# Patient Record
Sex: Male | Born: 1995 | Race: White | Hispanic: No | Marital: Single | Smoking: Former smoker
Health system: Southern US, Community
[De-identification: ages and names within clinical notes are randomized; demographics above are authoritative.]

## PROBLEM LIST (undated history)

## (undated) DIAGNOSIS — Z87898 Personal history of other specified conditions: Secondary | ICD-10-CM

## (undated) DIAGNOSIS — F1491 Cocaine use, unspecified, in remission: Secondary | ICD-10-CM

## (undated) DIAGNOSIS — F32A Depression, unspecified: Secondary | ICD-10-CM

## (undated) DIAGNOSIS — K8591 Acute pancreatitis with uninfected necrosis, unspecified: Secondary | ICD-10-CM

## (undated) DIAGNOSIS — F329 Major depressive disorder, single episode, unspecified: Secondary | ICD-10-CM

## (undated) DIAGNOSIS — F909 Attention-deficit hyperactivity disorder, unspecified type: Secondary | ICD-10-CM

## (undated) DIAGNOSIS — K859 Acute pancreatitis without necrosis or infection, unspecified: Secondary | ICD-10-CM

## (undated) DIAGNOSIS — F1290 Cannabis use, unspecified, uncomplicated: Secondary | ICD-10-CM

## (undated) HISTORY — PX: NO PAST SURGERIES: SHX2092

## (undated) HISTORY — PX: CIRCUMCISION: SUR203

## (undated) HISTORY — DX: Acute pancreatitis with uninfected necrosis, unspecified: K85.91

---
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
## 2013-02-21 ENCOUNTER — Other Ambulatory Visit (HOSPITAL_COMMUNITY): Payer: Self-pay | Admitting: Psychiatry

## 2013-03-14 ENCOUNTER — Emergency Department (HOSPITAL_COMMUNITY)
Admission: EM | Admit: 2013-03-14 | Discharge: 2013-03-14 | Disposition: A | Discharge status: Home / Self Care | Payer: BC Managed Care – PPO | Attending: Emergency Medicine | Admitting: Emergency Medicine

## 2013-03-14 ENCOUNTER — Encounter (HOSPITAL_COMMUNITY): Payer: Self-pay | Admitting: Emergency Medicine

## 2013-03-14 DIAGNOSIS — R110 Nausea: Secondary | ICD-10-CM | POA: Insufficient documentation

## 2013-03-14 DIAGNOSIS — R509 Fever, unspecified: Secondary | ICD-10-CM | POA: Insufficient documentation

## 2013-03-14 DIAGNOSIS — Z8719 Personal history of other diseases of the digestive system: Secondary | ICD-10-CM | POA: Insufficient documentation

## 2013-03-14 DIAGNOSIS — K859 Acute pancreatitis without necrosis or infection, unspecified: Secondary | ICD-10-CM

## 2013-03-14 DIAGNOSIS — Z8659 Personal history of other mental and behavioral disorders: Secondary | ICD-10-CM | POA: Insufficient documentation

## 2013-03-14 DIAGNOSIS — Z87891 Personal history of nicotine dependence: Secondary | ICD-10-CM | POA: Insufficient documentation

## 2013-03-14 LAB — COMPREHENSIVE METABOLIC PANEL
ALT: 24 U/L (ref 0–53)
AST: 28 U/L (ref 0–37)
Albumin: 4 g/dL (ref 3.5–5.2)
CO2: 31 mEq/L (ref 19–32)
Calcium: 10.1 mg/dL (ref 8.4–10.5)
GFR calc non Af Amer: >90 mL/min (ref >90)
Sodium: 140 mEq/L (ref 135–145)
Total Protein: 7 g/dL (ref 6.0–8.3)

## 2013-03-14 LAB — URINALYSIS, ROUTINE W REFLEX MICROSCOPIC
Glucose, UA: NEGATIVE mg/dL
Hgb urine dipstick: NEGATIVE
Leukocytes, UA: NEGATIVE
Specific Gravity, Urine: 1.014 (ref 1.005–1.030)
pH: 8 (ref 5.0–8.0)

## 2013-03-14 LAB — CBC WITH DIFFERENTIAL/PLATELET
Basophils Absolute: 0 10*3/uL (ref 0.0–0.1)
Basophils Relative: 0 % (ref 0–1)
Eosinophils Relative: 1 % (ref 0–5)
Lymphocytes Relative: 25 % (ref 12–46)
MCHC: 34.4 g/dL (ref 30.0–36.0)
MCV: 92.9 fL (ref 78.0–100.0)
Platelets: 345 10*3/uL (ref 150–400)
RDW: 12.7 % (ref 11.5–15.5)
WBC: 10.3 10*3/uL (ref 4.0–10.5)

## 2013-03-14 MED ORDER — MORPHINE SULFATE 4 MG/ML IJ SOLN
4.0000 mg | Freq: Once | INTRAMUSCULAR | Status: AC
  Administered 2013-03-14: 4 mg via INTRAVENOUS
  Filled 2013-03-14: qty 1

## 2013-03-14 MED ORDER — ONDANSETRON HCL 4 MG/2ML IJ SOLN
4.0000 mg | Freq: Once | INTRAMUSCULAR | Status: AC
  Administered 2013-03-14: 4 mg via INTRAVENOUS
  Filled 2013-03-14: qty 2

## 2013-03-14 MED ORDER — OXYCODONE-ACETAMINOPHEN 5-325 MG PO TABS: 1.0000 | ORAL_TABLET | ORAL | Status: DC | PRN

## 2013-03-14 MED ORDER — PROMETHAZINE HCL 25 MG PO TABS: 25.0000 mg | ORAL_TABLET | Freq: Four times a day (QID) | ORAL | Status: DC | PRN

## 2013-03-14 MED ORDER — SODIUM CHLORIDE 0.9 % IV BOLUS (SEPSIS)
1000.0000 mL | Freq: Once | INTRAVENOUS | Status: AC
  Administered 2013-03-14: 1000 mL via INTRAVENOUS

## 2013-03-14 NOTE — ED Notes (Signed)
Pts mom at window states pts chest is hurting him and he has been left out in the waiting room. I brought pt back to triage and reassessed. Pt c/o upper epigastric pain, states tightness to whole abdomen area, hx of pancreatitis but feels different, c/o nausea no vomiting; pts VSS, no distress noted; pt placed back in waiting area and notified pt will go back as soon as we can but let us know if pain get worse.

## 2013-03-14 NOTE — ED Provider Notes (Signed)
TIME SEEN: 3:20 PM  CHIEF COMPLAINT: Abdominal pain  HPI: Patient is a 17 year old male with a history of pancreatitis and pseudocyst who presents to the emergency department with one day of epigastric abdominal pain, nausea. He states that he drank a bottle of wine 2 days ago and he feels this may have triggered his pancreatitis. He is unsure if this pain is similar to his prior episode of pancreatitis. Denies any vomiting, diarrhea, melena or bloody stools. No NSAID use.  ROS: See HPI Constitutional: subjective fever  Eyes: no drainage  ENT: no runny nose   Cardiovascular:  no chest pain  Resp: no SOB  GI: no vomiting GU: no dysuria Integumentary: no rash  Allergy: no hives  Musculoskeletal: no leg swelling  Neurological: no slurred speech ROS otherwise negative  PAST MEDICAL HISTORY/PAST SURGICAL HISTORY:  Past Medical History  Diagnosis Date  . Pancreatitis, necrotizing   . Pancreatitis 10/2011  . Marijuana use   . History of cocaine use     states only used 1x 2 weeks before UDS +, associated with pancreatitis   . ADHD (attention deficit hyperactivity disorder)     MEDICATIONS:  Prior to Admission medications   Medication Sig Start Date End Date Taking? Authorizing Provider  calcium carbonate (TUMS - DOSED IN MG ELEMENTAL CALCIUM) 500 MG chewable tablet Chew 2 tablets by mouth once.   Yes Historical Provider, MD    ALLERGIES:  No Known Allergies  SOCIAL HISTORY:  History  Substance Use Topics  . Smoking status: Former Smoker -- 0.25 packs/day for 2 years    Types: Cigarettes    Start date: 07/27/2012    Quit date: 11/04/2012  . Smokeless tobacco: Current User  . Alcohol Use: No     Comment: 12/16/11 "quit drinking end of  Sept. 2013; Reports that the most he drank was a 6 pack or 3-4 shots a liquor.    FAMILY HISTORY: Family History  Problem Relation Age of Onset  . Hypertension Mother   . Anxiety disorder Mother   . Cancer Father   . Diabetes Father   .  Depression Father   . Drug abuse Father   . Cancer Paternal Aunt   . Heart disease Paternal Grandfather   . Emphysema Paternal Grandfather   . Heart attack Paternal Uncle     EXAM: BP 137/76  Pulse 75  Temp(Src) 98.7 F (37.1 C) (Oral)  Resp 18  SpO2 98% CONSTITUTIONAL: Alert and oriented and responds appropriately to questions. Well-appearing; well-nourished HEAD: Normocephalic EYES: Conjunctivae clear, PERRL ENT: normal nose; no rhinorrhea; moist mucous membranes; pharynx without lesions noted NECK: Supple, no meningismus, no LAD  CARD: RRR; S1 and S2 appreciated; no murmurs, no clicks, no rubs, no gallops RESP: Normal chest excursion without splinting or tachypnea; breath sounds clear and equal bilaterally; no wheezes, no rhonchi, no rales,  ABD/GI: Normal bowel sounds; non-distended; soft, non-tender, no rebound, no guarding BACK:  The back appears normal and is non-tender to palpation, there is no CVA tenderness EXT: Normal ROM in all joints; non-tender to palpation; no edema; normal capillary refill; no cyanosis    SKIN: Normal color for age and race; warm NEURO: Moves all extremities equally PSYCH: The patient's mood and manner are appropriate. Grooming and personal hygiene are appropriate.  MEDICAL DECISION MAKING: Patient with a history of pancreatitis who presents to ED with one day of similar symptoms after eating [place] this morning.  Has had a history of pancreatic pseudocyst that he reports  was drained in April 2013. He has had an endoscopy in the past which he reports was unremarkable. No history of peptic ulcer disease. No history of cholelithiasis. His abdominal exam is benign. His vitals are within normal limits. We'll obtain labs, urinalysis, and pain medication and anti-emetics. I do not feel he needs imaging at this time given his exam is very benign. Patient agrees with this plan.  ED PROGRESS: Patient has elevated lipase consistent with pancreatitis. Given  his very benign abdominal exam, I do not feel he needs abdominal imaging at this time. He is hemodynamically stable and has been able to tolerate by mouth. He is not vomiting in the ED. Patient and family would like discharge home with outpatient followup with her gastroenterologist, Dr. Dulce Sellar with [facility].  Given customary and usual return precautions. Patient verbalizes understanding is comfortable plan.  Layla Maw Ward, DO 03/14/13 1701

## 2013-03-14 NOTE — ED Notes (Signed)
MD at bedside. 

## 2013-03-14 NOTE — ED Notes (Signed)
Pt states hx of pancreatitis.  States that he has had upper abd pain x 1 day.  States that he has felt "bad" for a few days with diarrhea and nausea.  States he was in ICU for pancreatitis last year. States that he drank a bottle of wine a couple days ago.

## 2013-05-21 ENCOUNTER — Ambulatory Visit (INDEPENDENT_AMBULATORY_CARE_PROVIDER_SITE_OTHER): Payer: BC Managed Care – PPO | Admitting: Emergency Medicine

## 2013-05-21 VITALS — BP 102/60 | HR 86 | Temp 98.4°F | Resp 16 | Ht 72.5 in | Wt 185.2 lb

## 2013-05-21 DIAGNOSIS — L500 Allergic urticaria: Secondary | ICD-10-CM

## 2013-05-21 MED ORDER — TRIAMCINOLONE ACETONIDE 0.1 % EX CREA: TOPICAL_CREAM | Freq: Two times a day (BID) | CUTANEOUS | Status: DC

## 2013-05-21 MED ORDER — METHYLPREDNISOLONE ACETATE 80 MG/ML IJ SUSP
80.0000 mg | Freq: Once | INTRAMUSCULAR | Status: AC
  Administered 2013-05-21: 80 mg via INTRAMUSCULAR
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
## 2013-05-21 NOTE — Patient Instructions (Signed)

## 2013-05-22 ENCOUNTER — Emergency Department (HOSPITAL_COMMUNITY)
Admission: EM | Admit: 2013-05-22 | Discharge: 2013-05-22 | Disposition: A | Discharge status: Home / Self Care | Payer: BC Managed Care – PPO | Attending: Emergency Medicine | Admitting: Emergency Medicine

## 2013-05-22 ENCOUNTER — Encounter (HOSPITAL_COMMUNITY): Payer: Self-pay | Admitting: Emergency Medicine

## 2013-05-22 DIAGNOSIS — Z87891 Personal history of nicotine dependence: Secondary | ICD-10-CM | POA: Insufficient documentation

## 2013-05-22 DIAGNOSIS — Z8659 Personal history of other mental and behavioral disorders: Secondary | ICD-10-CM | POA: Insufficient documentation

## 2013-05-22 DIAGNOSIS — L519 Erythema multiforme, unspecified: Secondary | ICD-10-CM | POA: Insufficient documentation

## 2013-05-22 DIAGNOSIS — F1210 Cannabis abuse, uncomplicated: Secondary | ICD-10-CM | POA: Insufficient documentation

## 2013-05-22 DIAGNOSIS — Z8719 Personal history of other diseases of the digestive system: Secondary | ICD-10-CM | POA: Insufficient documentation

## 2013-05-22 MED ORDER — HYDROXYZINE HCL 25 MG PO TABS
25.0000 mg | ORAL_TABLET | Freq: Once | ORAL | Status: AC
  Administered 2013-05-22: 25 mg via ORAL
  Filled 2013-05-22: qty 1

## 2013-05-22 MED ORDER — HYDROXYZINE HCL 25 MG PO TABS: 25.0000 mg | ORAL_TABLET | Freq: Four times a day (QID) | ORAL | Status: DC

## 2013-05-22 NOTE — ED Provider Notes (Signed)
CSN: [id number]     Arrival date & time 05/22/13  0440 History   First MD Initiated Contact with Patient 05/22/13 [phone]     Chief Complaint  Patient presents with  . Rash   HPI  History provided by the patient. Patient is a 17 year old male who presents with complaints of persistent pruritic rash of the skin. Patient first began having red lesions and rash with slight itching to his lower extremities one week ago. He states this occurred shortly after he was outside cutting wood with a chain saw. He states his legs were covered with sawdust. Since that time he has had waxing and waning rash over his body and also complains of significant itching especially to the scalp. Patient has been taking Benadryl to try to help with the itching without any relief. He also used hydrocortisone and calamine lotion creams at home. He reports the best improvement with calamine lotions. Yesterday he went to an urgent care center where she was given an injection of hydrocortisone steroid but states he continued to have new areas of rash on the skin. He denies having any associated fever, chills or sweats. Denies any respiratory complaints. Denies having similar symptoms previously. No other aggravating or alleviating factors. No other associated symptoms.    Past Medical History  Diagnosis Date  . Pancreatitis, necrotizing   . Pancreatitis 10/2011  . Marijuana use   . History of cocaine use     states only used 1x 2 weeks before UDS +, associated with pancreatitis   . ADHD (attention deficit hyperactivity disorder)    Past Surgical History  Procedure Laterality Date  . Circumcision    . No past surgeries    . Eus  06/29/2012    Procedure: ESOPHAGEAL ENDOSCOPIC ULTRASOUND (EUS) RADIAL;  Surgeon: Arta Silence, MD;  Location: [facility];  Service: Endoscopy;  Laterality: N/A;  . Esophagogastroduodenoscopy  08/03/2012    Procedure: ESOPHAGOGASTRODUODENOSCOPY (EGD);  Surgeon: Arta Silence, MD;  Location: [facility];  Service: Endoscopy;  Laterality: N/A;  . Eus  08/03/2012    Procedure: UPPER ENDOSCOPIC ULTRASOUND (EUS) LINEAR;  Surgeon: Arta Silence, MD;  Location: [facility];  Service: Endoscopy;  Laterality: N/A;   Family History  Problem Relation Age of Onset  . Hypertension Mother   . Anxiety disorder Mother   . Cancer Father   . Diabetes Father   . Depression Father   . Drug abuse Father   . Cancer Paternal Aunt   . Heart disease Paternal Grandfather   . Emphysema Paternal Grandfather   . Heart attack Paternal Uncle    History  Substance Use Topics  . Smoking status: Former Smoker -- 0.25 packs/day for 2 years    Types: Cigarettes    Start date: 07/27/2012    Quit date: 11/04/2012  . Smokeless tobacco: Current User  . Alcohol Use: No     Comment: 12/16/11 "quit drinking end of  Sept. 2013; Reports that the most he drank was a 6 pack or 3-4 shots a liquor.    Review of Systems  Constitutional: Negative for fever, chills and diaphoresis.  HENT: Negative for sore throat.   Respiratory: Negative for cough, shortness of breath, wheezing and stridor.   Cardiovascular: Negative for chest pain.  Skin: Positive for rash.  All other systems reviewed and are negative.    Allergies  Review of patient's allergies indicates no known allergies.  Home Medications   Current Outpatient Rx  Name  Route  Sig  Dispense  Refill  . calamine lotion   Topical   Apply 1 application topically 2 (two) times daily as needed (itching).         . diphenhydrAMINE (BENADRYL) 12.5 MG/5ML liquid   Oral   Take 37.5 mg by mouth 3 (three) times daily as needed for itching or allergies.         . hydrocortisone cream 1 %   Topical   Apply 1 application topically 3 (three) times daily as needed (itch).          BP 110/70  Pulse 90  Temp(Src) 98.4 F (36.9 C) (Oral)  Resp 15  Ht 6' (1.829 m)  Wt 185 lb (83.915 kg)  BMI 25.08 kg/m2  SpO2 97% Physical Exam  Nursing note and  vitals reviewed. Constitutional: He appears well-developed and well-nourished. No distress.  HENT:  Head: Normocephalic.  Neck: Normal range of motion. Neck supple.  Cardiovascular: Normal rate and regular rhythm.   Pulmonary/Chest: Effort normal and breath sounds normal. No stridor. No respiratory distress. He has no wheezes. He has no rales.  Neurological: He is alert.  Skin: Skin is warm. Rash noted.  There is a singular annular erythematous from lesion to the left lower abdomen. Several other erythematous macular lesions to the anterior lower legs, right lower abdomen. Several erythematous macular serpentine-type lesions to the scalp. There is occasional nodularity to the lesions on the scalp.  Psychiatric: He has a normal mood and affect. His behavior is normal.    ED Course  Procedures    COORDINATION OF CARE:  Nursing notes reviewed. Vital signs reviewed. Initial pt interview and examination performed.   5:35 AM-patient seen and evaluated. Patient appears well no acute distress. Pt was also seen adn evaluated by Attending Physician.  At this time suspect the rash may be caused by Erythema multiforme minor. Discussed continued treatment plan with pt at bedside, which includes hydroxyzine for itching. We will also plan to give dermatology referral.  Pt agrees with plan.    MDM   1. Erythema multiforme minor        Martie Lee, [place] 05/22/13 [phone]

## 2013-05-22 NOTE — ED Provider Notes (Signed)
Scattered target lesions consistent with erythema multiforme minor.  Medical screening examination/treatment/procedure(s) were conducted as a shared visit with non-physician practitioner(s) and myself.  I personally evaluated the patient during the encounter.  EKG Interpretation   None         Wynetta Fines, MD 05/22/13 [phone]

## 2013-05-22 NOTE — ED Notes (Signed)
Pt has rash to b/l LE as well as on his back, back of his neck, head and b/l lower abdomen.  Rash is light pink, flat  and circular.  Pt reports burning itching sensation in areas rash is present.

## 2013-05-22 NOTE — ED Notes (Signed)
Pt A&O x3, denies pain.  D/c instructions as well as follow up reviewed no questions at this time.

## 2013-05-22 NOTE — ED Notes (Signed)
Pt states after cutting firewood 1 week ago and becoming covered with saw dust now having extreme pruritis to legs, head and neck. Pt seen by urgent care given steroid injection and taking PO Benadryl. Pt feels itching and redness worse.

## 2013-07-12 IMAGING — CT CT ABD-PELV W/ CM
2 of 4 series · 12 of 32 positions shown, 17 images · IV contrast (water/omni  & 80ml omni 300)
Comparison: No priors.

CLINICAL DATA: Abdominal pain.  Hematuria.

CT ABDOMEN AND PELVIS WITH CONTRAST
TECHNIQUE: Multidetector CT imaging of the abdomen and pelvis was
performed following the standard protocol during bolus
administration of intravenous contrast.
Contrast: 80mL OMNIPAQUE IOHEXOL 300 MG/ML  SOLN

[Series 2: routine abdomen · axial · 0.70mm/px · z∈[-485,-110]mm · 8 of 97 slices shown, 13 images]
[im 11/97  soft-tissue]
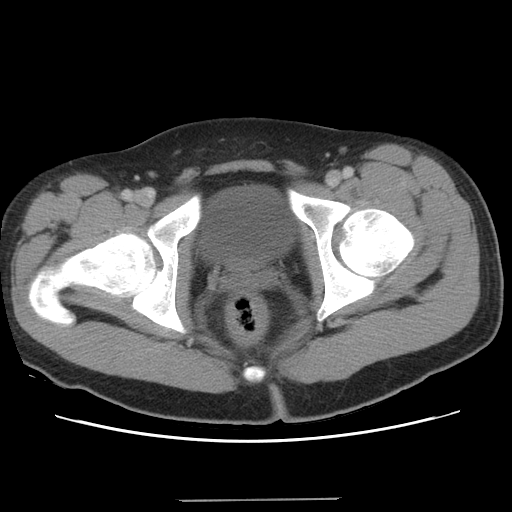
[im 11/97  bone]
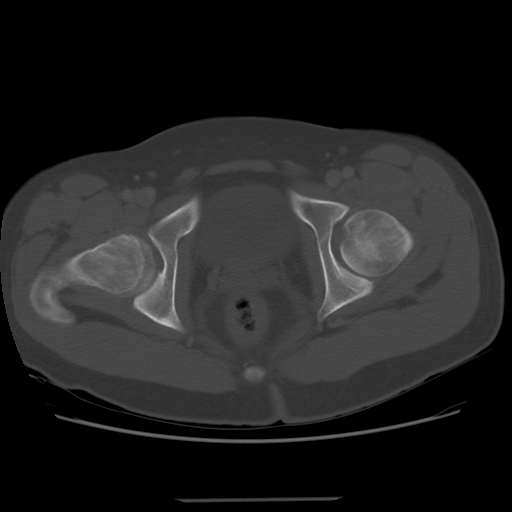
[im 22/97  soft-tissue]
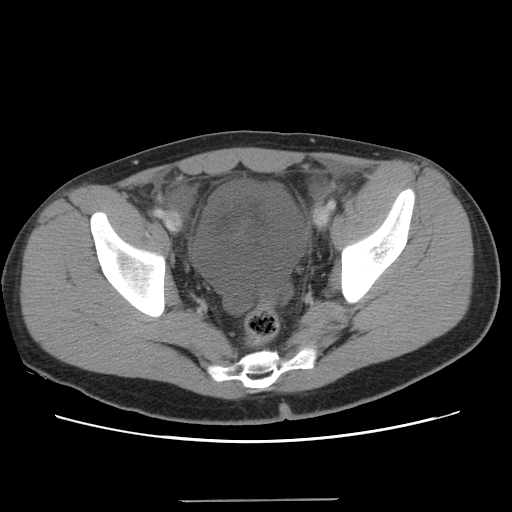
[im 33/97  soft-tissue]
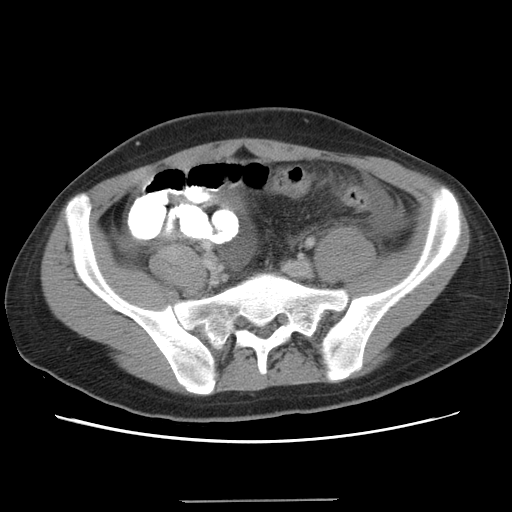
[im 43/97  soft-tissue]
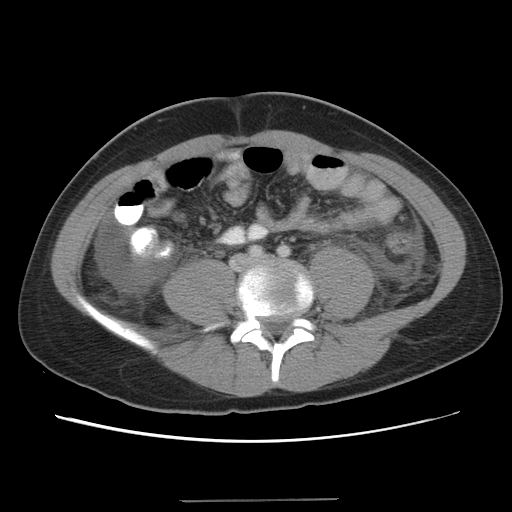
[im 54/97  soft-tissue]
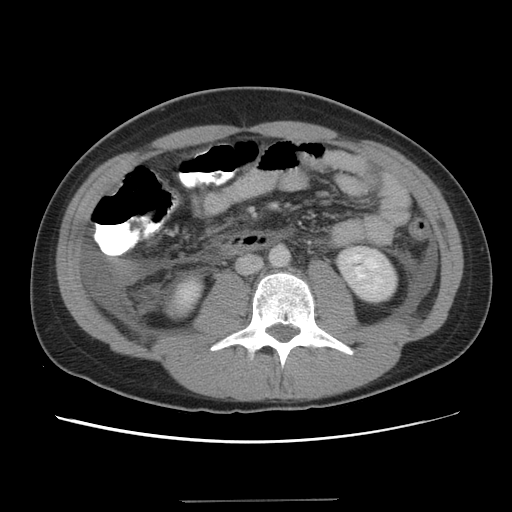
[im 54/97  lung]
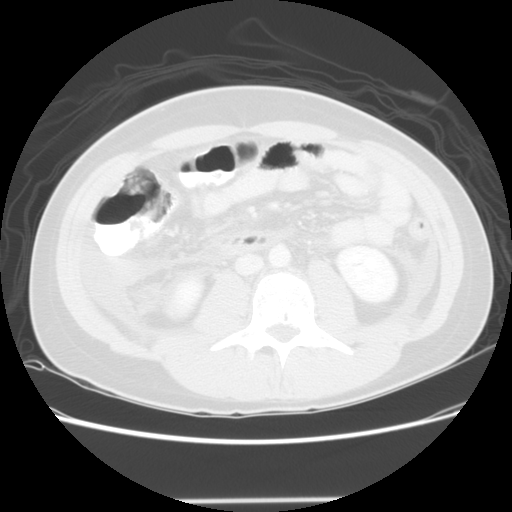
[im 65/97  soft-tissue]
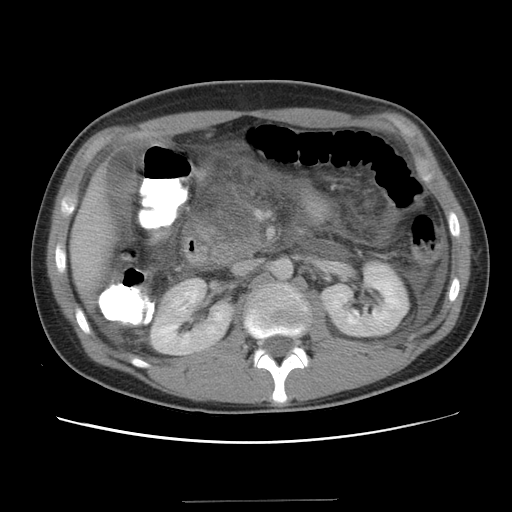
[im 65/97  lung]
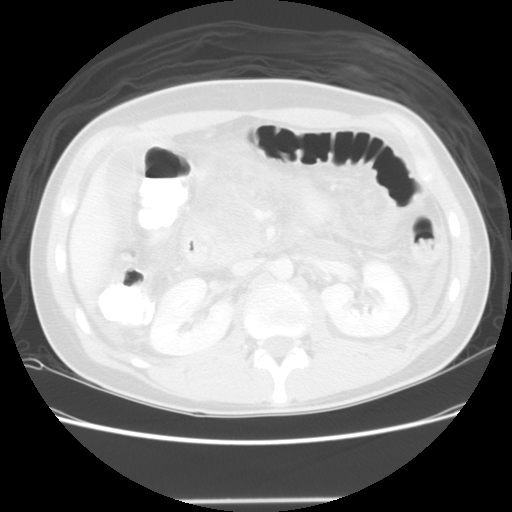
[im 75/97  soft-tissue]
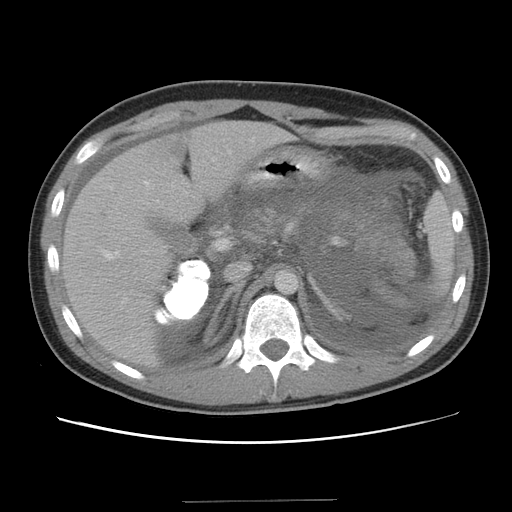
[im 75/97  lung]
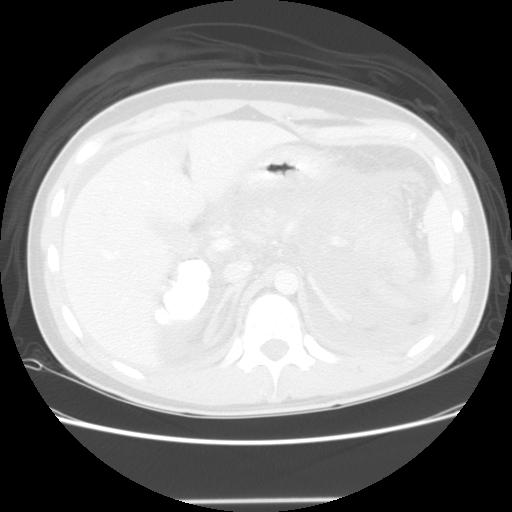
[im 86/97  soft-tissue]
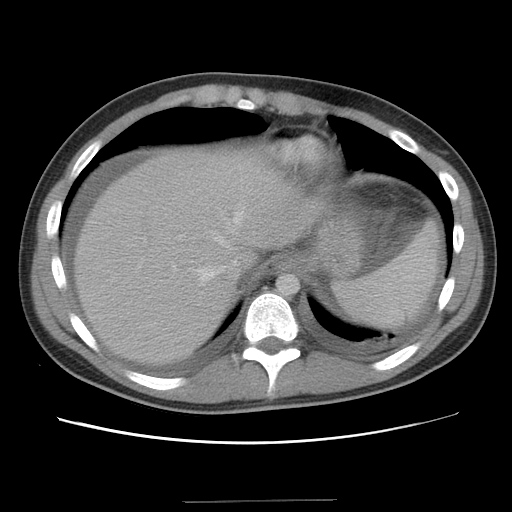
[im 86/97  lung]
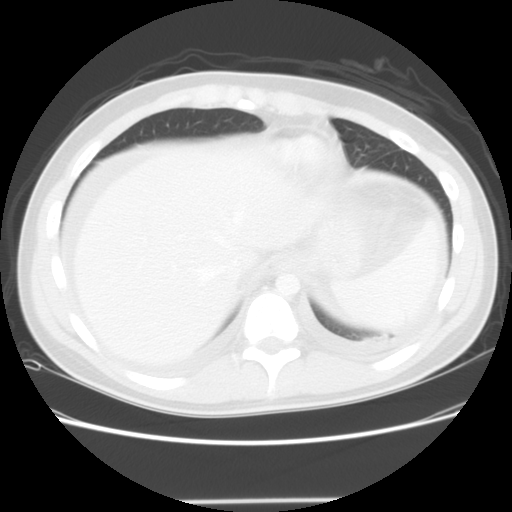

[Series 400: sagittal · sagittal · 0.99mm/px · 4 of 105 slices shown]
[im 11/105  soft-tissue]
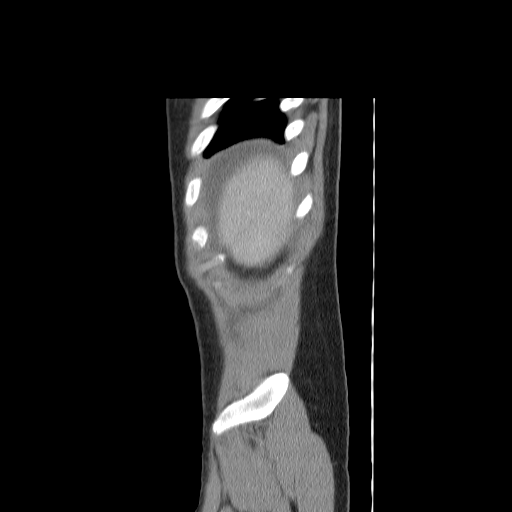
[im 21/105  soft-tissue]
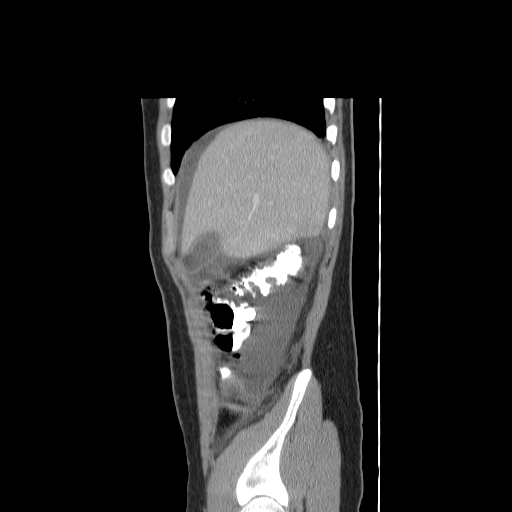
[im 32/105  soft-tissue]
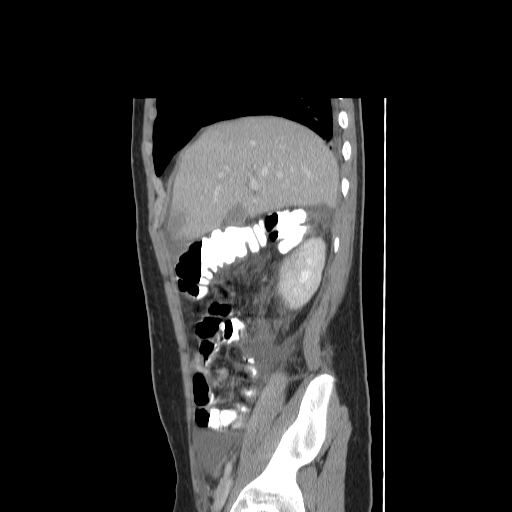
[im 42/105  soft-tissue]
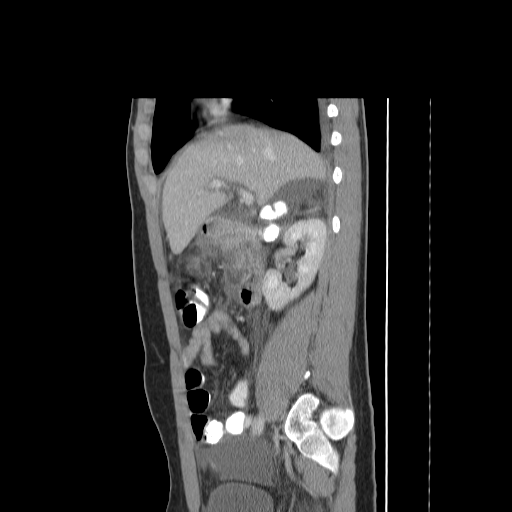

[12 of 32 positions shown; findings below may reference images not displayed]

FINDINGS: Lung Bases: Small bilateral pleural effusions layering dependently.
A small amount of pneumomediastinum is noted around the distal
esophagus.

Abdomen/Pelvis:  The pancreas appears enlarged, demonstrates poor
parenchymal enhancement throughout the majority of the body, and
has extensive surrounding inflammatory changes throughout the
retroperitoneum with inflammatory exudate extending around the
spleen and kidneys bilaterally.  The splenic vein appears narrowed
(presumably from mass effect from adjacent inflammation), however,
the splenic vein, superior mesenteric vein and portal veins are all
patent at this time.  The inflammatory exudate is also tracking
into the lesser omentum and into the mesocolon in the region of the
splenic flexure resulting in some mild colonic wall thickening.
The appendix appears to be normal (demonstrated on image 53 of
series 2).  Moderate - large volume of ascites which appears non
bloody at this time.

Within the segment 8 of the liver there is a linear low attenuation
region which is nonspecific, however, there is a suggestion of a
dilated intrahepatic bile duct leading up to this region.  No other
evidence to suggest intrahepatic biliary ductal dilatation.  The
common bile duct itself appears mildly enlarged in the porta
hepatis (9 mm).  There is a suggestion of a small high attenuation
focus in the dependent portion of the neck of the gallbladder
(image 24 series 2), which could suggest a small gallstone.  Focal
low attenuation is noted adjacent the falciform within segment 4B
of the liver, compatible with focal fatty infiltration and/or
perfusion anomaly.  The remainder the liver is otherwise
unremarkable in appearance.  The spleen, bilateral adrenal glands
and bilateral kidneys are normal in appearance at this time.  No
pneumoperitoneum.  Several borderline dilated loops of small bowel
are noted, which may suggest the presence of a reactive ileus.
Urinary bladder is unremarkable.

Musculoskeletal: There are no aggressive appearing lytic or blastic
lesions noted in the visualized portions of the skeleton.
IMPRESSION: 1.  Findings, as above, consistent with severe pancreatitis.  There
is decreased enhancement throughout the entire body of the pancreas
with relative preserved enhancement in the head, uncinate process
and distal tail.  Findings are concerning for early necrotizing
pancreatitis.  At this time, the portal vein, SMV and splenic vein
are all patent, however, there is severe attenuation of the splenic
vein (presumably from surrounding inflammation), which may suggest
an increased risk for pending splenic vein thrombosis.
2.  Mildly dilated common bile duct (9 mm), with the suggestion of
a calcified gallstone in the neck of the gallbladder.  Although no
distal ductal stone is noted, this may suggest an etiology for the
patient's pancreatitis.
3.  Nonspecific linear low attenuation in segment 8 of the liver.
There is a suggestion of a single minimally dilated intrahepatic
duct extending toward this region.  Clinical correlation for signs
and symptoms of biliary tract obstruction is recommended.
4. Gas adjacent to the distal esophagus in the middle mediastinum.
This is highly concerning for potential esophageal rupture,
particularly if the patient has had a history of recent emesis
(i.e., Jumper syndrome).  Clinical correlation is recommended.
5.  Moderate volume of ascites.
6.  Small bilateral pleural effusions are simple in appearance and
are layering dependently.
7.  Probable mild small bowel ileus.

These results were called by telephone on 11/04/2011  at  [DATE]
a.m. to  Dr.Crawb, who verbally acknowledged these results.

## 2013-07-25 ENCOUNTER — Encounter (HOSPITAL_COMMUNITY): Payer: Self-pay | Admitting: Emergency Medicine

## 2013-07-25 ENCOUNTER — Emergency Department (HOSPITAL_COMMUNITY)
Admission: EM | Admit: 2013-07-25 | Discharge: 2013-07-26 | Disposition: A | Discharge status: Home / Self Care | Payer: BC Managed Care – PPO | Attending: Emergency Medicine | Admitting: Emergency Medicine

## 2013-07-25 DIAGNOSIS — F909 Attention-deficit hyperactivity disorder, unspecified type: Secondary | ICD-10-CM | POA: Insufficient documentation

## 2013-07-25 DIAGNOSIS — F329 Major depressive disorder, single episode, unspecified: Secondary | ICD-10-CM | POA: Insufficient documentation

## 2013-07-25 DIAGNOSIS — Z8719 Personal history of other diseases of the digestive system: Secondary | ICD-10-CM | POA: Insufficient documentation

## 2013-07-25 DIAGNOSIS — R45851 Suicidal ideations: Secondary | ICD-10-CM | POA: Insufficient documentation

## 2013-07-25 DIAGNOSIS — Z87891 Personal history of nicotine dependence: Secondary | ICD-10-CM | POA: Insufficient documentation

## 2013-07-25 DIAGNOSIS — F1210 Cannabis abuse, uncomplicated: Secondary | ICD-10-CM | POA: Insufficient documentation

## 2013-07-25 DIAGNOSIS — Z79899 Other long term (current) drug therapy: Secondary | ICD-10-CM | POA: Insufficient documentation

## 2013-07-25 HISTORY — DX: Major depressive disorder, single episode, unspecified: F32.9

## 2013-07-25 HISTORY — DX: Depression, unspecified: F32.A

## 2013-07-25 LAB — RAPID URINE DRUG SCREEN, HOSP PERFORMED
Amphetamines: NOT DETECTED
Barbiturates: NOT DETECTED

## 2013-07-25 LAB — CBC
HCT: 47.8 % (ref 39.0–52.0)
MCV: 93.7 fL (ref 78.0–100.0)
Platelets: 259 10*3/uL (ref 150–400)
RBC: 5.1 MIL/uL (ref 4.22–5.81)
WBC: 12.4 10*3/uL — ABNORMAL HIGH (ref 4.0–10.5)

## 2013-07-25 LAB — ETHANOL: Alcohol, Ethyl (B): <11 mg/dL (ref 0–11)

## 2013-07-25 LAB — SALICYLATE LEVEL: Salicylate Lvl: <2 mg/dL — ABNORMAL LOW (ref 2.8–20.0)

## 2013-07-25 LAB — COMPREHENSIVE METABOLIC PANEL
ALT: 15 U/L (ref 0–53)
AST: 20 U/L (ref 0–37)
Alkaline Phosphatase: 52 U/L (ref 39–117)
BUN: 7 mg/dL (ref 6–23)
CO2: 26 mEq/L (ref 19–32)
Chloride: 104 mEq/L (ref 96–112)
Creatinine, Ser: 0.94 mg/dL (ref 0.50–1.35)
GFR calc non Af Amer: >90 mL/min (ref >90)
Potassium: 4.1 mEq/L (ref 3.7–5.3)
Total Bilirubin: 0.4 mg/dL (ref 0.3–1.2)
Total Protein: 5.8 g/dL — ABNORMAL LOW (ref 6.0–8.3)

## 2013-07-25 LAB — ACETAMINOPHEN LEVEL: Acetaminophen (Tylenol), Serum: <15 ug/mL (ref 10–30)

## 2013-07-25 MED ORDER — LORAZEPAM 1 MG PO TABS
1.0000 mg | ORAL_TABLET | Freq: Three times a day (TID) | ORAL | Status: DC | PRN
  Administered 2013-07-25 – 2013-07-26 (×3): 1 mg via ORAL
  Filled 2013-07-25 (×3): qty 1

## 2013-07-25 MED ORDER — ACETAMINOPHEN 325 MG PO TABS: 650.0000 mg | ORAL_TABLET | ORAL | Status: DC | PRN

## 2013-07-25 NOTE — Progress Notes (Signed)
Patient's Mommy listed at  [phone]. Patient's Dad listed at [phone].

## 2013-07-25 NOTE — ED Notes (Signed)
Patient continues to sleep. Respirations even and unlabored.  Patient safety maintained, Q 15 checks continue.

## 2013-07-25 NOTE — ED Notes (Signed)
Pt came to the nurses station requesting to have his clothes so he can get dressed and leaved. Explained to pt that Dr Lynelle Doctor will come to talk to him soon; he agreed to wait.

## 2013-07-25 NOTE — BH Assessment (Signed)
Consulted with Dr. Linwood Dibbles to obtain clinicals prior to assessing patient.  Glorious Peach, MS, LCASA Assessment Counselor

## 2013-07-25 NOTE — ED Notes (Signed)
Pt presents to ed with c/o depression and suicidal thoughts, sts he is having issues with girl that is staying with him. Pt denies having plan, but endorses SI

## 2013-07-25 NOTE — ED Provider Notes (Signed)
CSN: [id number]     Arrival date & time 07/25/13  1519 History   First MD Initiated Contact with Patient 07/25/13 1608     Chief Complaint  Patient presents with  . Medical Clearance    The history is provided by the patient.  Pt has been having trouble depression, anxiety and suicidal ideation.  Symptoms have been ongoing for a month but have been worsening.  He started to have thoughts of hurting himself today. He was thinking about cutting his wrists and strangling himself as well as other ways.  He saw his doctor today and was prescribed zoloft but didn't think that would work fast enough.  He felt unsafe and came to the ED. He denies alcohol use but does use marijuana.   Past Medical History  Diagnosis Date  . Pancreatitis, necrotizing   . Pancreatitis 10/2011  . Marijuana use   . History of cocaine use     states only used 1x 2 weeks before UDS +, associated with pancreatitis   . ADHD (attention deficit hyperactivity disorder)    Past Surgical History  Procedure Laterality Date  . Circumcision    . No past surgeries    . Eus  06/29/2012    Procedure: ESOPHAGEAL ENDOSCOPIC ULTRASOUND (EUS) RADIAL;  Surgeon: Willis Modena, MD;  Location: [facility];  Service: Endoscopy;  Laterality: N/A;  . Esophagogastroduodenoscopy  08/03/2012    Procedure: ESOPHAGOGASTRODUODENOSCOPY (EGD);  Surgeon: Willis Modena, MD;  Location: [facility];  Service: Endoscopy;  Laterality: N/A;  . Eus  08/03/2012    Procedure: UPPER ENDOSCOPIC ULTRASOUND (EUS) LINEAR;  Surgeon: Willis Modena, MD;  Location: [facility];  Service: Endoscopy;  Laterality: N/A;   Family History  Problem Relation Age of Onset  . Hypertension Mother   . Anxiety disorder Mother   . Cancer Father   . Diabetes Father   . Depression Father   . Drug abuse Father   . Cancer Paternal Aunt   . Heart disease Paternal Grandfather   . Emphysema Paternal Grandfather   . Heart attack Paternal Uncle    History  Substance Use  Topics  . Smoking status: Former Smoker -- 0.25 packs/day for 2 years    Types: Cigarettes    Start date: 07/27/2012    Quit date: 11/04/2012  . Smokeless tobacco: Current User  . Alcohol Use: No     Comment: 12/16/11 "quit drinking end of  Sept. 2013; Reports that the most he drank was a 6 pack or 3-4 shots a liquor.    Review of Systems  All other systems reviewed and are negative.    Allergies  Review of patient's allergies indicates no known allergies.  Home Medications   Current Outpatient Rx  Name  Route  Sig  Dispense  Refill  . cetirizine (ZYRTEC) 10 MG tablet   Oral   Take 10 mg by mouth daily.          BP 115/62  Pulse 73  Temp(Src) 98.1 F (36.7 C)  Resp 18  SpO2 98% Physical Exam  Nursing note and vitals reviewed. Constitutional: He appears well-developed and well-nourished. No distress.  HENT:  Head: Normocephalic and atraumatic.  Right Ear: External ear normal.  Left Ear: External ear normal.  Eyes: Conjunctivae are normal. Right eye exhibits no discharge. Left eye exhibits no discharge. No scleral icterus.  Neck: Neck supple. No tracheal deviation present.  Cardiovascular: Normal rate, regular rhythm and intact distal pulses.   Pulmonary/Chest: Effort normal and breath  sounds normal. No stridor. No respiratory distress. He has no wheezes. He has no rales.  Abdominal: Soft. Bowel sounds are normal. He exhibits no distension. There is no tenderness. There is no rebound and no guarding.  Musculoskeletal: He exhibits no edema and no tenderness.  Neurological: He is alert. He has normal strength. No sensory deficit. Cranial nerve deficit:  no gross defecits noted. He exhibits normal muscle tone. He displays no seizure activity. Coordination normal.  Skin: Skin is warm and dry. No rash noted.  Psychiatric: His speech is not rapid and/or pressured and not delayed. He is withdrawn. Cognition and memory are not impaired. He exhibits a depressed mood. He  expresses suicidal ideation. He expresses suicidal plans.    ED Course  Procedures (including critical care time) Labs Review Labs Reviewed  CBC - Abnormal; Notable for the following:    WBC 12.4 (*)    All other components within normal limits  COMPREHENSIVE METABOLIC PANEL - Abnormal; Notable for the following:    Total Protein 5.8 (*)    All other components within normal limits  SALICYLATE LEVEL - Abnormal; Notable for the following:    Salicylate Lvl <2.0 (*)    All other components within normal limits  URINE RAPID DRUG SCREEN (HOSP PERFORMED) - Abnormal; Notable for the following:    Tetrahydrocannabinol POSITIVE (*)    All other components within normal limits  ACETAMINOPHEN LEVEL  ETHANOL   Imaging Review No results found.  EKG Interpretation   None       MDM   1. Suicidal ideation    1800 Pt is medically cleared.  Will move to psych ED for assessment.  Pt has expressed his frustration with the amount of time that it is taking.  I apologized for the delay but explained to the patient that I will not allow him to leave without a psychiatric evaluation.  If necessary , I will place him on an involuntary psychiatric hold.  Pt is willing to comply at this time.    Celene Kras, MD 07/25/13 [phone]

## 2013-07-26 ENCOUNTER — Inpatient Hospital Stay (HOSPITAL_COMMUNITY): Admission: EM | Admit: 2013-07-26 | Payer: BC Managed Care – PPO | Source: Intra-hospital | Admitting: Psychiatry

## 2013-07-26 ENCOUNTER — Encounter (HOSPITAL_COMMUNITY): Payer: Self-pay | Admitting: *Deleted

## 2013-07-26 DIAGNOSIS — R45851 Suicidal ideations: Secondary | ICD-10-CM

## 2013-07-26 MED ORDER — HALOPERIDOL LACTATE 5 MG/ML IJ SOLN
5.0000 mg | Freq: Once | INTRAMUSCULAR | Status: AC
  Administered 2013-07-26: 5 mg via INTRAMUSCULAR
  Filled 2013-07-26: qty 1

## 2013-07-26 MED ORDER — ONDANSETRON HCL 4 MG PO TABS
4.0000 mg | ORAL_TABLET | Freq: Once | ORAL | Status: AC
  Administered 2013-07-26: 4 mg via ORAL
  Filled 2013-07-26: qty 1

## 2013-07-26 MED ORDER — DIPHENHYDRAMINE HCL 50 MG/ML IJ SOLN
50.0000 mg | Freq: Once | INTRAMUSCULAR | Status: DC
  Filled 2013-07-26: qty 1

## 2013-07-26 MED ORDER — DIPHENHYDRAMINE HCL 50 MG/ML IJ SOLN
50.0000 mg | Freq: Once | INTRAMUSCULAR | Status: AC
  Administered 2013-07-26: 50 mg via INTRAMUSCULAR

## 2013-07-26 NOTE — BH Assessment (Signed)
Consulted with Dr. Lynelle Doctor regarding pt needing to be IVC'D if he is refuses to sign consent for inpatient treatment voluntarily.  Glorious Peach, MS, LCASA Assessment Counselor

## 2013-07-26 NOTE — ED Notes (Signed)
Patient agitated, cursing. States that "I don't have a choice". Patient upset about going to [facility]. States that he wants to go home. Patient climbed on shelf in his room. Security called. Patient later placed a chair in the hall by the door. Security called again. Patient continued to request to get more medication to help with his agitation/anger.  Patient given Haldol and Benadryl. Patient remains restless.  0140, patient requested to use phone. Patient instructed to wait until 9am. Patient stomped back to his room and threw his chair across the room. Security called to assist in removing furniture for safety.

## 2013-07-26 NOTE — ED Provider Notes (Signed)
Pt seen by Dr. Ladona Ridgel and cleared pt for DC and outpatient treatment.  Rolland Porter, MD 07/26/13 [phone]

## 2013-07-26 NOTE — Consult Note (Signed)
[facility] Face-to-Face Psychiatry Consult   Reason for Consult:  Expressing suicidal thoughts Referring Physician:  ER MD  Tim Huff is an 17 y.o. male.  Assessment: AXIS I:  Anxiety Disorder NOS,Major depression,single episode, moderate AXIS II:  Deferred AXIS III:   Past Medical History  Diagnosis Date  . Pancreatitis, necrotizing   . Pancreatitis 10/2011  . Marijuana use   . History of cocaine use     states only used 1x 2 weeks before UDS +, associated with pancreatitis   . ADHD (attention deficit hyperactivity disorder)   . Depression    AXIS IV:  other psychosocial or environmental problems and problems related to social environment AXIS V:  51-60 moderate symptoms  Plan:  No evidence of imminent risk to self or others at present.    Subjective:   Tim Huff is a 18 y.o. male patient admitted with suicidal thoughts.  HPI:   Mr Tim Huff says he has been stressed by his relationship with his girlfriend and his family.  He was not specific but earlier had reported issues with dropping out of high school and not getting his GED which has been stressing him.  He has a chronic rash all over his body which has not been responding  To therapy and that is a big stress.  The day of admission he was stressed but did not say what that was and was thinking suicidal thoughts.  Today he is very clear that he is not suicidal and though he is depressed, can deal with his problems and he wants to live.   Past Psychiatric History: Past Medical History  Diagnosis Date  . Pancreatitis, necrotizing   . Pancreatitis 10/2011  . Marijuana use   . History of cocaine use     states only used 1x 2 weeks before UDS +, associated with pancreatitis   . ADHD (attention deficit hyperactivity disorder)   . Depression     reports that he quit smoking about 8 months ago. His smoking use included Cigarettes. He started smoking about a year ago. He has a .5 pack-year smoking history. He uses  smokeless tobacco. He reports that he uses illicit drugs (Marijuana) about twice per week. He reports that he does not drink alcohol. Family History  Problem Relation Age of Onset  . Hypertension Mother   . Anxiety disorder Mother   . Cancer Father   . Diabetes Father   . Depression Father   . Drug abuse Father   . Cancer Paternal Aunt   . Heart disease Paternal Grandfather   . Emphysema Paternal Grandfather   . Heart attack Paternal Uncle    Family History Substance Abuse: No Family Supports: Yes, List: (mother) Living Arrangements: Parent Can pt return to current living arrangement?: Yes Abuse/Neglect [facility]) Physical Abuse: Denies Verbal Abuse: Denies Sexual Abuse: Denies Allergies:  No Known Allergies  ACT Assessment Complete:  Yes:    Educational Status    Risk to Self: Risk to self Suicidal Ideation: Yes-Currently Present (Pt reported SI to EDP, passive SI reported during TTS assess) Suicidal Intent: No (denied during TTS assessment, plan noted per EDP-Knapp) Is patient at risk for suicide?: Yes Suicidal Plan?: Yes-Currently Present Specify Current Suicidal Plan: pt reported specific plan to EDP-Dr. Lynelle Doctor, denied plan during TTS assessment Access to Means: No (pt denies during TTS assessment) What has been your use of drugs/alcohol within the last 12 months?: THC Previous Attempts/Gestures: No (pt denies) How many times?: 0 Other Self Harm  Risks: none reported Triggers for Past Attempts: Unpredictable Intentional Self Injurious Behavior: None Family Suicide History: No (Pt reports "everbody in my family is crazy as shit") Recent stressful life event(s): Conflict (Comment);Other (Comment) (pt lacks motivation,did not complete HS or GED) Persecutory voices/beliefs?: No Depression: Yes Depression Symptoms: Loss of interest in usual pleasures;Feeling worthless/self pity;Feeling angry/irritable Substance abuse history and/or treatment for substance abuse?: Yes Suicide  prevention information given to non-admitted patients: Not applicable  Risk to Others: Risk to Others Homicidal Ideation: No Thoughts of Harm to Others: No Current Homicidal Intent: No Current Homicidal Plan: No Access to Homicidal Means: No Identified Victim: na History of harm to others?: No Assessment of Violence: None Noted Violent Behavior Description:  (Pt presents agitated and angry during TTS assessment) Does patient have access to weapons?: Yes (Comment) ( reports access to knives but has not thought of harming w/k) Criminal Charges Pending?: Yes Describe Pending Criminal Charges:  (pt reports pending felony larceny charges) Does patient have a court date: No  Abuse: Abuse/Neglect Assessment (Assessment to be complete while patient is alone) Physical Abuse: Denies Verbal Abuse: Denies Sexual Abuse: Denies Exploitation of patient/patient's resources: Denies Self-Neglect: Denies  Prior Inpatient Therapy: Prior Inpatient Therapy Prior Inpatient Therapy: No Prior Therapy Dates: na Prior Therapy Facilty/Provider(s): na Reason for Treatment: na  Prior Outpatient Therapy: Prior Outpatient Therapy Prior Outpatient Therapy: No Prior Therapy Dates: na Prior Therapy Facilty/Provider(s): na Reason for Treatment: na  Additional Information: Additional Information 1:1 In Past 12 Months?: No CIRT Risk: Yes Elopement Risk: Yes Does patient have medical clearance?: Yes                  Objective: Blood pressure 107/67, pulse 66, temperature 97.6 F (36.4 C), temperature source Oral, resp. rate 18, SpO2 97.00%.There is no weight on file to calculate BMI. Results for orders placed during the hospital encounter of 07/25/13 (from the past 72 hour(s))  URINE RAPID DRUG SCREEN (HOSP PERFORMED)     Status: Abnormal   Collection Time    07/25/13  4:36 PM      Result Value Range   Opiates NONE DETECTED  NONE DETECTED   Cocaine NONE DETECTED  NONE DETECTED    Benzodiazepines NONE DETECTED  NONE DETECTED   Amphetamines NONE DETECTED  NONE DETECTED   Tetrahydrocannabinol POSITIVE (*) NONE DETECTED   Barbiturates NONE DETECTED  NONE DETECTED   Comment:            DRUG SCREEN FOR MEDICAL PURPOSES     ONLY.  IF CONFIRMATION IS NEEDED     FOR ANY PURPOSE, NOTIFY LAB     WITHIN 5 DAYS.                LOWEST DETECTABLE LIMITS     FOR URINE DRUG SCREEN     Drug Class       Cutoff (ng/mL)     Amphetamine      1000     Barbiturate      200     Benzodiazepine   200     Tricyclics       300     Opiates          300     Cocaine          300     THC              50  ACETAMINOPHEN LEVEL     Status: None   Collection Time  07/25/13  4:45 PM      Result Value Range   Acetaminophen (Tylenol), Serum <15.0  10 - 30 ug/mL   Comment:            THERAPEUTIC CONCENTRATIONS VARY     SIGNIFICANTLY. A RANGE OF 10-30     ug/mL MAY BE AN EFFECTIVE     CONCENTRATION FOR MANY PATIENTS.     HOWEVER, SOME ARE BEST TREATED     AT CONCENTRATIONS OUTSIDE THIS     RANGE.     ACETAMINOPHEN CONCENTRATIONS     >150 ug/mL AT 4 HOURS AFTER     INGESTION AND >50 ug/mL AT 12     HOURS AFTER INGESTION ARE     OFTEN ASSOCIATED WITH TOXIC     REACTIONS.  CBC     Status: Abnormal   Collection Time    07/25/13  4:45 PM      Result Value Range   WBC 12.4 (*) 4.0 - 10.5 K/uL   RBC 5.10  4.22 - 5.81 MIL/uL   Hemoglobin 16.4  13.0 - 17.0 g/dL   HCT 78.4  69.6 - 29.5 %   MCV 93.7  78.0 - 100.0 fL   MCH 32.2  26.0 - 34.0 pg   MCHC 34.3  30.0 - 36.0 g/dL   RDW 28.4  13.2 - 44.0 %   Platelets 259  150 - 400 K/uL  COMPREHENSIVE METABOLIC PANEL     Status: Abnormal   Collection Time    07/25/13  4:45 PM      Result Value Range   Sodium 140  137 - 147 mEq/L   Comment: Please note change in reference range.   Potassium 4.1  3.7 - 5.3 mEq/L   Comment: Please note change in reference range.   Chloride 104  96 - 112 mEq/L   CO2 26  19 - 32 mEq/L   Glucose, Bld 81  70 - 99  mg/dL   BUN 7  6 - 23 mg/dL   Creatinine, Ser 1.02  0.50 - 1.35 mg/dL   Calcium 9.5  8.4 - 72.5 mg/dL   Total Protein 5.8 (*) 6.0 - 8.3 g/dL   Albumin 3.5  3.5 - 5.2 g/dL   AST 20  0 - 37 U/L   ALT 15  0 - 53 U/L   Alkaline Phosphatase 52  39 - 117 U/L   Total Bilirubin 0.4  0.3 - 1.2 mg/dL   GFR calc non Af Amer >90  >90 mL/min   GFR calc Af Amer >90  >90 mL/min   Comment: (NOTE)     The eGFR has been calculated using the CKD EPI equation.     This calculation has not been validated in all clinical situations.     eGFR's persistently <90 mL/min signify possible Chronic Kidney     Disease.  ETHANOL     Status: None   Collection Time    07/25/13  4:45 PM      Result Value Range   Alcohol, Ethyl (B) <11  0 - 11 mg/dL   Comment:            LOWEST DETECTABLE LIMIT FOR     SERUM ALCOHOL IS 11 mg/dL     FOR MEDICAL PURPOSES ONLY  SALICYLATE LEVEL     Status: Abnormal   Collection Time    07/25/13  4:45 PM      Result Value Range   Salicylate Lvl <2.0 (*) 2.8 -  20.0 mg/dL   Labs are reviewed and are pertinent for no psychiatric issues.  Current Facility-Administered Medications  Medication Dose Route Frequency Provider Last Rate Last Dose  . acetaminophen (TYLENOL) tablet 650 mg  650 mg Oral Q4H PRN Celene Kras, MD      . LORazepam (ATIVAN) tablet 1 mg  1 mg Oral Q8H PRN Celene Kras, MD   1 mg at 07/26/13 1610   Current Outpatient Prescriptions  Medication Sig Dispense Refill  . cetirizine (ZYRTEC) 10 MG tablet Take 10 mg by mouth daily.        Psychiatric Specialty Exam:     Blood pressure 107/67, pulse 66, temperature 97.6 F (36.4 C), temperature source Oral, resp. rate 18, SpO2 97.00%.There is no weight on file to calculate BMI.  General Appearance: Casual and obvious rash on face and hands  Eye Contact::  Good  Speech:  Clear and Coherent  Volume:  Normal  Mood:  Irritable  Affect:  Appropriate  Thought Process:  Goal Directed and Logical  Orientation:  Full  (Time, Place, and Person)  Thought Content:  Negative  Suicidal Thoughts:  No  Homicidal Thoughts:  No  Memory:  Immediate;   Good Recent;   Good Remote;   Good  Judgement:  Good  Insight:  Fair  Psychomotor Activity:  Normal  Concentration:  Good  Recall:  Good  Akathisia:  Negative  Handed:  Right  AIMS (if indicated):     Assets:  Communication Skills Desire for Improvement Financial Resources/Insurance Housing Leisure Time Social Support Transportation  Sleep:      Treatment Plan Summary: Discharge home today.  He has just started Zoloft from his primary care doctor, he will be given out patient referrals for therapy.  TAYLOR,GERALD D 07/26/2013 10:23 AM

## 2013-07-26 NOTE — Progress Notes (Addendum)
Patient signed a no harm contract.  Writer consulted with the Psychiatrist (Dr. Ladona Ridgel) and the NP [facility]) regarding the patient being psychiatrically stable for discharge with resources.  Dr. Ladona Ridgel rescinded the IVC.  Writer faxed the IVC forms to [employer]. Writer gave the original to the nurse.  Writer provided outpatient resources for the patient.   Writer informed the ER MD (Dr. Fayrene Fearing) and the nurse Georganna Skeans) that the patient will be discharged.

## 2013-07-26 NOTE — ED Notes (Signed)
Patient discharged to home with no signs of distress. Patient reports that he will drive himself home. Patient reported pain at 1/10 in abdomen. Patient denies SI/HI and A/V hallucinations.

## 2013-07-26 NOTE — BH Assessment (Signed)
Tele Assessment Note   Tim Huff is an 17 y.o. male. Pt presents with C/O of Depression and SI. Pt reports "I was"suicidal when questioned about whether he was currently having thoughts of suicide. Pt verbally stated to TTS, " I will say anything that I have to, to leave here. Pt continued to verbalize, "I want to kill myself in a joking manner". Pt presents angry, agitated,and belligerent. Pt continues to use profanity during TTS assessment. Pt states that he wants to go home because he is being driven crazy in the ED.Pt reports that he is trapped in the hospital and nobody will let him out. Pt reports that he does not have any freedom in the ED. Pt reports that his suicidal thoughts were triggered today because he was in a bad mood. Pt reports  issues with family drama but refuses to elaborate further. Pt reports that he is tired of dealing with his family drama. Pt reports recent conflict with his ex-girlfriend as he would not elaborate further . Pt reports feeling stressed and lacking motivation about completing his education. Pt reports that he dropped out of school after completing his 11th grade year and has not completed HS or earned his GED and states that people say he is stupid. Pt does not appear to be a reliable historian as evidenced by the conflicting information that he reports to TTS and EDP. Pt denies HI and no AVH reported.Pt is unable to reliably contract for safety at this time. TTS informed EDP Dr. Linwood Dibbles about pt's acceptance to [facility].  Per Collateral from EDP Dr.Jon Knapp: Pt has been having trouble depression, anxiety and suicidal ideation. Symptoms have been ongoing for a month but have been worsening. He started to have thoughts of hurting himself today. He was thinking about cutting his wrists and strangling himself as well as other ways. He saw his doctor today and was prescribed zoloft but didn't think that would work fast enough. He felt unsafe and came to the ED.  He  denies alcohol use but does use marijuana.   Disposition: Pt accepted to [facility] for inpatient treatment. Pt has been assigned to bed 502-2. Pt accepted to [facility] by Donell Sievert. Pt assigned to Dr. Elsie Saas. Pt will need to be IVC's as patient is not cooperating in the ED with ED staff. Spoke with TTS Patsy Lager and Pt's nurse Meghan who will initiate IVC papers. Pt can be transferred to [facility] when IVC paper's have been completed and served.     Axis I: Major Depression, single episode Axis II: Deferred Axis III:  Past Medical History  Diagnosis Date  . Pancreatitis, necrotizing   . Pancreatitis 10/2011  . Marijuana use   . History of cocaine use     states only used 1x 2 weeks before UDS +, associated with pancreatitis   . ADHD (attention deficit hyperactivity disorder)   . Depression    Axis IV: other psychosocial or environmental problems, problems related to legal system/crime and problems related to social environment Axis V: 31-40 impairment in reality testing  Past Medical History:  Past Medical History  Diagnosis Date  . Pancreatitis, necrotizing   . Pancreatitis 10/2011  . Marijuana use   . History of cocaine use     states only used 1x 2 weeks before UDS +, associated with pancreatitis   . ADHD (attention deficit hyperactivity disorder)   . Depression     Past Surgical History  Procedure Laterality Date  . Circumcision    .  No past surgeries    . Eus  06/29/2012    Procedure: ESOPHAGEAL ENDOSCOPIC ULTRASOUND (EUS) RADIAL;  Surgeon: Willis Modena, MD;  Location: [facility] ENDOSCOPY;  Service: Endoscopy;  Laterality: N/A;  . Esophagogastroduodenoscopy  08/03/2012    Procedure: ESOPHAGOGASTRODUODENOSCOPY (EGD);  Surgeon: Willis Modena, MD;  Location: [facility];  Service: Endoscopy;  Laterality: N/A;  . Eus  08/03/2012    Procedure: UPPER ENDOSCOPIC ULTRASOUND (EUS) LINEAR;  Surgeon: Willis Modena, MD;  Location: [facility] ENDOSCOPY;  Service: Endoscopy;  Laterality: N/A;    Family  History:  Family History  Problem Relation Age of Onset  . Hypertension Mother   . Anxiety disorder Mother   . Cancer Father   . Diabetes Father   . Depression Father   . Drug abuse Father   . Cancer Paternal Aunt   . Heart disease Paternal Grandfather   . Emphysema Paternal Grandfather   . Heart attack Paternal Uncle     Social History:  reports that he quit smoking about 8 months ago. His smoking use included Cigarettes. He started smoking about a year ago. He has a .5 pack-year smoking history. He uses smokeless tobacco. He reports that he uses illicit drugs (Marijuana) about twice per week. He reports that he does not drink alcohol.  Additional Social History:  Alcohol / Drug Use History of alcohol / drug use?: Yes Substance #1 Name of Substance 1:  (Marijuana) 1 - Age of First Use:  (11) 1 - Amount (size/oz):  ("a bunch of weed") 1 - Frequency:  (Daily) 1 - Duration:  (On-going use) 1 - Last Use / Amount:  (07/25/13-1 bowl)  CIWA: CIWA-Ar BP: 101/66 mmHg Pulse Rate: 101 COWS:    Allergies: No Known Allergies  Home Medications:  (Not in a hospital admission)  OB/GYN Status:  No LMP for male patient.  General Assessment Data Location of Assessment: [facility] Assessment Services Is this a Tele or Face-to-Face Assessment?: Tele Assessment Is this an Initial Assessment or a Re-assessment for this encounter?: Initial Assessment Living Arrangements: Parent Can pt return to current living arrangement?: Yes Admission Status: Voluntary Is patient capable of signing voluntary admission?: Yes Transfer from: Acute Hospital Referral Source: MD     [facility] Crisis Care Plan Living Arrangements: Parent Name of Psychiatrist: pt denies that he has a current provider Name of Therapist: pt denies that he has a current provider  Education Status Is patient currently in school?: No Current Grade: currently not in school Highest grade of school patient has completed: 11th Name of  school: na Contact person: na  Risk to self Suicidal Ideation: Yes-Currently Present (Pt reported SI to EDP, passive SI reported during TTS assess) Suicidal Intent: No (denied during TTS assessment, plan noted per EDP-Knapp) Is patient at risk for suicide?: Yes Suicidal Plan?: Yes-Currently Present Specify Current Suicidal Plan: pt reported specific plan to EDP-Dr. Lynelle Doctor, denied plan during TTS assessment Access to Means: No (pt denies during TTS assessment) What has been your use of drugs/alcohol within the last 12 months?: THC Previous Attempts/Gestures: No (pt denies) How many times?: 0 Other Self Harm Risks: none reported Triggers for Past Attempts: Unpredictable Intentional Self Injurious Behavior: None Family Suicide History: No (Pt reports "everbody in my family is crazy as shit") Recent stressful life event(s): Conflict (Comment);Other (Comment) (pt lacks motivation,did not complete HS or GED) Persecutory voices/beliefs?: No Depression: Yes Depression Symptoms: Loss of interest in usual pleasures;Feeling worthless/self pity;Feeling angry/irritable Substance abuse history and/or treatment for substance abuse?: Yes Suicide  prevention information given to non-admitted patients: Not applicable  Risk to Others Homicidal Ideation: No Thoughts of Harm to Others: No Current Homicidal Intent: No Current Homicidal Plan: No Access to Homicidal Means: No Identified Victim: na History of harm to others?: No Assessment of Violence: None Noted Violent Behavior Description:  (Pt presents agitated and angry during TTS assessment) Does patient have access to weapons?: Yes (Comment) ( reports access to knives but has not thought of harming w/k) Criminal Charges Pending?: Yes Describe Pending Criminal Charges:  (pt reports pending felony larceny charges) Does patient have a court date: No  Psychosis Hallucinations: None noted Delusions: None noted  Mental Status Report Appear/Hygiene:  Other (Comment) (Pt appears stated age) Eye Contact: Fair Motor Activity: Freedom of movement Speech: Logical/coherent;Loud Level of Consciousness: Alert;Irritable Mood: Depressed;Angry Affect: Angry;Anxious;Depressed Anxiety Level: Minimal Thought Processes: Coherent;Relevant;Circumstantial Judgement: Impaired Orientation: Person;Place;Time;Situation Obsessive Compulsive Thoughts/Behaviors: None  Cognitive Functioning Concentration: Decreased Memory: Recent Intact;Remote Intact IQ: Average Insight: Poor Impulse Control: Poor Appetite: Poor Weight Loss: 0 Weight Gain: 0 Sleep: No Change Total Hours of Sleep:  (on-going issues w/sleep, not been able to sleep in months) Vegetative Symptoms: Staying in bed  ADLScreening [facility] Assessment Services) Patient's cognitive ability adequate to safely complete daily activities?: Yes Patient able to express need for assistance with ADLs?: Yes Independently performs ADLs?: Yes (appropriate for developmental age)  Prior Inpatient Therapy Prior Inpatient Therapy: No Prior Therapy Dates: na Prior Therapy Facilty/Provider(s): na Reason for Treatment: na  Prior Outpatient Therapy Prior Outpatient Therapy: No Prior Therapy Dates: na Prior Therapy Facilty/Provider(s): na Reason for Treatment: na  ADL Screening (condition at time of admission) Patient's cognitive ability adequate to safely complete daily activities?: Yes Is the patient deaf or have difficulty hearing?: No Does the patient have difficulty seeing, even when wearing glasses/contacts?: No Does the patient have difficulty concentrating, remembering, or making decisions?: No Patient able to express need for assistance with ADLs?: Yes Does the patient have difficulty dressing or bathing?: No Independently performs ADLs?: Yes (appropriate for developmental age) Does the patient have difficulty walking or climbing stairs?: No Weakness of Legs: None Weakness of Arms/Hands:  None  Home Assistive Devices/Equipment Home Assistive Devices/Equipment: None    Abuse/Neglect Assessment (Assessment to be complete while patient is alone) Physical Abuse: Denies Verbal Abuse: Denies Sexual Abuse: Denies Exploitation of patient/patient's resources: Denies Self-Neglect: Denies Values / Beliefs Cultural Requests During Hospitalization: None Spiritual Requests During Hospitalization: None   Advance Directives (For Healthcare) Advance Directive: Patient does not have advance directive;Patient would not like information    Additional Information 1:1 In Past 12 Months?: No CIRT Risk: Yes Elopement Risk: Yes Does patient have medical clearance?: Yes     Disposition:  Disposition Initial Assessment Completed for this Encounter: Yes Disposition of Patient: Inpatient treatment program Type of inpatient treatment program: Adult  Gerline Legacy, MS, LCASA Assessment Counselor  07/26/2013 12:49 AM

## 2013-07-30 ENCOUNTER — Encounter (HOSPITAL_COMMUNITY): Payer: Self-pay | Admitting: Emergency Medicine

## 2013-07-30 ENCOUNTER — Emergency Department (HOSPITAL_COMMUNITY)
Admission: EM | Admit: 2013-07-30 | Discharge: 2013-07-30 | Disposition: A | Discharge status: Home / Self Care | Payer: BC Managed Care – PPO | Attending: Emergency Medicine | Admitting: Emergency Medicine

## 2013-07-30 ENCOUNTER — Emergency Department (HOSPITAL_COMMUNITY): Payer: BC Managed Care – PPO

## 2013-07-30 DIAGNOSIS — S39012A Strain of muscle, fascia and tendon of lower back, initial encounter: Secondary | ICD-10-CM

## 2013-07-30 DIAGNOSIS — Y92410 Unspecified street and highway as the place of occurrence of the external cause: Secondary | ICD-10-CM | POA: Insufficient documentation

## 2013-07-30 DIAGNOSIS — S335XXA Sprain of ligaments of lumbar spine, initial encounter: Secondary | ICD-10-CM | POA: Insufficient documentation

## 2013-07-30 DIAGNOSIS — S060X0A Concussion without loss of consciousness, initial encounter: Secondary | ICD-10-CM | POA: Insufficient documentation

## 2013-07-30 DIAGNOSIS — Z8659 Personal history of other mental and behavioral disorders: Secondary | ICD-10-CM | POA: Insufficient documentation

## 2013-07-30 DIAGNOSIS — Z87891 Personal history of nicotine dependence: Secondary | ICD-10-CM | POA: Insufficient documentation

## 2013-07-30 DIAGNOSIS — Y9389 Activity, other specified: Secondary | ICD-10-CM | POA: Insufficient documentation

## 2013-07-30 DIAGNOSIS — Z79899 Other long term (current) drug therapy: Secondary | ICD-10-CM | POA: Insufficient documentation

## 2013-07-30 DIAGNOSIS — R42 Dizziness and giddiness: Secondary | ICD-10-CM | POA: Insufficient documentation

## 2013-07-30 DIAGNOSIS — Z8719 Personal history of other diseases of the digestive system: Secondary | ICD-10-CM | POA: Insufficient documentation

## 2013-07-30 MED ORDER — CYCLOBENZAPRINE HCL 10 MG PO TABS: 10.0000 mg | ORAL_TABLET | Freq: Two times a day (BID) | ORAL | Status: DC | PRN

## 2013-07-30 MED ORDER — IBUPROFEN 800 MG PO TABS: 800.0000 mg | ORAL_TABLET | Freq: Three times a day (TID) | ORAL | Status: DC
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
## 2013-07-30 NOTE — Discharge Instructions (Signed)
Concussion, Adult °A concussion, or closed-head injury, is a brain injury caused by a direct blow to the head or by a quick and sudden movement (jolt) of the head or neck. Concussions are usually not life-threatening. Even so, the effects of a concussion can be serious. If you have had a concussion before, you are more likely to experience concussion-like symptoms after a direct blow to the head.  °CAUSES  °· Direct blow to the head, such as from running into another player during a soccer game, being hit in a fight, or hitting your head on a hard surface. °· A jolt of the head or neck that causes the brain to move back and forth inside the skull, such as in a car crash. °SIGNS AND SYMPTOMS  °The signs of a concussion can be hard to notice. Early on, they may be missed by you, family members, and health care providers. You may look fine but act or feel differently. °Symptoms are usually temporary, but they may last for days, weeks, or even longer. Some symptoms may appear right away while others may not show up for hours or days. Every head injury is different. Symptoms include:  °· Mild to moderate headaches that will not go away. °· A feeling of pressure inside your head.  °· Having more trouble than usual:   °· Learning or remembering things you have heard. °· Answering questions.  °· Paying attention or concentrating.   °· Organizing daily tasks.   °· Making decisions and solving problems.   °· Slowness in thinking, acting or reacting, speaking, or reading.   °· Getting lost or being easily confused.   °· Feeling tired all the time or lacking energy (fatigued).   °· Feeling drowsy.   °· Sleep disturbances.   °· Sleeping more than usual.   °· Sleeping less than usual.   °· Trouble falling asleep.   °· Trouble sleeping (insomnia).   °· Loss of balance or feeling lightheaded or dizzy.   °· Nausea or vomiting.   °· Numbness or tingling.   °· Increased sensitivity to:   °· Sounds.   °· Lights.   °· Distractions.    °· Vision problems or eyes that tire easily.   °· Diminished sense of taste or smell.   °· Ringing in the ears.   °· Mood changes such as feeling sad or anxious.   °· Becoming easily irritated or angry for little or no reason.   °· Lack of motivation. °· Seeing or hearing things other people do not see or hear (hallucinations). °DIAGNOSIS  °Your health care provider can usually diagnose a concussion based on a description of your injury and symptoms. He or she will ask whether you passed out (lost consciousness) and whether you are having trouble remembering events that happened right before and during your injury.  °Your evaluation might include:  °· A brain scan to look for signs of injury to the brain. Even if the test shows no injury, you may still have a concussion.   °· Blood tests to be sure other problems are not present. °TREATMENT  °· Concussions are usually treated in an emergency department, in urgent care, or at a clinic. You may need to stay in the hospital overnight for further treatment.   °· Tell your health care provider if you are taking any medicines, including prescription medicines, over-the-counter medicines, and natural remedies. Some medicines, such as blood thinners (anticoagulants) and aspirin, may increase the chance of complications. Also tell your health care provider whether you have had alcohol or are taking illegal drugs. This information may affect treatment. °· Your health care provider will send you   home with important instructions to follow. °· How fast you will recover from a concussion depends on many factors. These factors include how severe your concussion is, what part of your brain was injured, your age, and how healthy you were before the concussion. °· Most people with mild injuries recover fully. Recovery can take time. In general, recovery is slower in older persons. Also, persons who have had a concussion in the past or have other medical problems may find that it  takes longer to recover from their current injury. °HOME CARE INSTRUCTIONS  °General Instructions °· Carefully follow the directions your health care provider gave you. °· Only take over-the-counter or prescription medicines for pain, discomfort, or fever as directed by your health care provider. °· Take only those medicines that your health care provider has approved. °· Do not drink alcohol until your health care provider says you are well enough to do so. Alcohol and certain other drugs may slow your recovery and can put you at risk of further injury. °· If it is harder than usual to remember things, write them down. °· If you are easily distracted, try to do one thing at a time. For example, do not try to watch TV while fixing dinner. °· Talk with family members or close friends when making important decisions. °· Keep all follow-up appointments. Repeated evaluation of your symptoms is recommended for your recovery. °· Watch your symptoms and tell others to do the same. Complications sometimes occur after a concussion. Older adults with a brain injury may have a higher risk of serious complications such as of a blood clot on the brain. °· Tell your teachers, school nurse, school counselor, coach, athletic trainer, or work manager about your injury, symptoms, and restrictions. Tell them about what you can or cannot do. They should watch for:   °· Increased problems with attention or concentration.   °· Increased difficulty remembering or learning new information.   °· Increased time needed to complete tasks or assignments.   °· Increased irritability or decreased ability to cope with stress.   °· Increased symptoms.   °· Rest. Rest helps the brain to heal. Make sure you: °· Get plenty of sleep at night. Avoid staying up late at night. °· Keep the same bedtime hours on weekends and weekdays. °· Rest during the day. Take daytime naps or rest breaks when you feel tired. °· Limit activities that require a lot of  thought or concentration. These includes   °· Doing homework or job-related work.   °· Watching TV.   °· Working on the computer. °· Avoid any situation where there is potential for another head injury (football, hockey, soccer, basketball, martial arts, downhill snow sports and horseback riding). Your condition will get worse every time you experience a concussion. You should avoid these activities until you are evaluated by the appropriate follow-up caregivers. °Returning To Your Regular Activities °You will need to return to your normal activities slowly, not all at once. You must give your body and brain enough time for recovery. °· Do not return to sports or other athletic activities until your health care provider tells you it is safe to do so. °· Ask your health care provider when you can drive, ride a bicycle, or operate heavy machinery. Your ability to react may be slower after a brain injury. Never do these activities if you are dizzy. °· Ask your health care provider about when you can return to work or school. °Preventing Another Concussion °It is very important to avoid another   brain injury, especially before you have recovered. In rare cases, another injury can lead to permanent brain damage, brain swelling, or death. The risk of this is greatest during the first 7 10 days after a head injury. Avoid injuries by:   Wearing a seat belt when riding in a car.   Drinking alcohol only in moderation.   Wearing a helmet when biking, skiing, skateboarding, skating, or doing similar activities.  Avoiding activities that could lead to a second concussion, such as contact or recreational sports, until your health care provider says it is OK.  Taking safety measures in your home.   Remove clutter and tripping hazards from floors and stairways.   Use grab bars in bathrooms and handrails by stairs.   Place non-slip mats on floors and in bathtubs.   Improve lighting in dim areas. SEEK MEDICAL  CARE IF:   You have increased problems paying attention or concentrating.   You have increased difficulty remembering or learning new information.   You need more time to complete tasks or assignments than before.   You have increased irritability or decreased ability to cope with stress.  You have more symptoms than before. Seek medical care if you have any of the following symptoms for more than 2 weeks after your injury:   Lasting (chronic) headaches.   Dizziness or balance problems.   Nausea.  Vision problems.   Increased sensitivity to noise or light.   Depression or mood swings.   Anxiety or irritability.   Memory problems.   Difficulty concentrating or paying attention.   Sleep problems.   Feeling tired all the time. SEEK IMMEDIATE MEDICAL CARE IF:   You have severe or worsening headaches. These may be a sign of a blood clot in the brain.  You have weakness (even if only in one hand, leg, or part of the face).  You have numbness.  You have decreased coordination.   You vomit repeatedly.  You have increased sleepiness.  One pupil is larger than the other.   You have convulsions.   You have slurred speech.   You have increased confusion. This may be a sign of a blood clot in the brain.  You have increased restlessness, agitation, or irritability.   You are unable to recognize people or places.   You have neck pain.   It is difficult to wake you up.   You have unusual behavior changes.   You lose consciousness. MAKE SURE YOU:   Understand these instructions.  Will watch your condition.  Will get help right away if you are not doing well or get worse. Document Released: 10/03/2003 Document Revised: 03/15/2013 Document Reviewed: 02/02/2013 [facility] Patient Information 2014 [place].  Lumbosacral Strain Lumbosacral strain is one of the most common causes of back pain. There are many causes of back pain. Most  are not serious conditions. CAUSES  Your backbone (spinal column) is made up of 24 main vertebral bodies, the sacrum, and the coccyx. These are held together by muscles and tough, fibrous tissue (ligaments). Nerve roots pass through the openings between the vertebrae. A sudden move or injury to the back may cause injury to, or pressure on, these nerves. This may result in localized back pain or pain movement (radiation) into the buttocks, down the leg, and into the foot. Sharp, shooting pain from the buttock down the back of the leg (sciatica) is frequently associated with a ruptured (herniated) disk. Pain may be caused by muscle spasm alone. Your  caregiver can often find the cause of your pain by the details of your symptoms and an exam. In some cases, you may need tests (such as X-rays). Your caregiver will work with you to decide if any tests are needed based on your specific exam. HOME CARE INSTRUCTIONS   Avoid an underactive lifestyle. Active exercise, as directed by your caregiver, is your greatest weapon against back pain.  Avoid hard physical activities (tennis, racquetball, waterskiing) if you are not in proper physical condition for it. This may aggravate or create problems.  If you have a back problem, avoid sports requiring sudden body movements. Swimming and walking are generally safer activities.  Maintain good posture.  Avoid becoming overweight (obese).  Use bed rest for only the most extreme, sudden (acute) episode. Your caregiver will help you determine how much bed rest is necessary.  For acute conditions, you may put ice on the injured area.  Put ice in a plastic bag.  Place a towel between your skin and the bag.  Leave the ice on for 15-20 minutes at a time, every 2 hours, or as needed.  After you are improved and more active, it may help to apply heat for 30 minutes before activities. See your caregiver if you are having pain that lasts longer than expected. Your  caregiver can advise appropriate exercises or therapy if needed. With conditioning, most back problems can be avoided. SEEK IMMEDIATE MEDICAL CARE IF:   You have numbness, tingling, weakness, or problems with the use of your arms or legs.  You experience severe back pain not relieved with medicines.  There is a change in bowel or bladder control.  You have increasing pain in any area of the body, including your belly (abdomen).  You notice shortness of breath, dizziness, or feel faint.  You feel sick to your stomach (nauseous), are throwing up (vomiting), or become sweaty.  You notice discoloration of your toes or legs, or your feet get very cold.  Your back pain is getting worse.  You have a fever. MAKE SURE YOU:   Understand these instructions.  Will watch your condition.  Will get help right away if you are not doing well or get worse. Document Released: 04/22/2005 Document Revised: 10/05/2011 Document Reviewed: 10/12/2008 [facility] Patient Information 2014 [place], [place].

## 2013-08-12 NOTE — ED Provider Notes (Signed)
Medical screening examination/treatment/procedure(s) were performed by non-physician practitioner and as supervising physician I was immediately available for consultation/collaboration.  EKG Interpretation   None         Rolland PorterMark Leshae Mcclay, MD 08/12/13 [phone]

## 2013-09-30 ENCOUNTER — Emergency Department (HOSPITAL_COMMUNITY)
Admission: EM | Admit: 2013-09-30 | Discharge: 2013-10-01 | Disposition: A | Discharge status: Home / Self Care | Payer: BC Managed Care – PPO | Attending: Emergency Medicine | Admitting: Emergency Medicine

## 2013-09-30 ENCOUNTER — Encounter (HOSPITAL_COMMUNITY): Payer: Self-pay | Admitting: Emergency Medicine

## 2013-09-30 ENCOUNTER — Emergency Department (HOSPITAL_COMMUNITY): Payer: BC Managed Care – PPO

## 2013-09-30 ENCOUNTER — Emergency Department (HOSPITAL_COMMUNITY)
Admission: EM | Admit: 2013-09-30 | Discharge: 2013-09-30 | Disposition: A | Discharge status: Home / Self Care | Payer: BC Managed Care – PPO | Attending: Emergency Medicine | Admitting: Emergency Medicine

## 2013-09-30 DIAGNOSIS — K859 Acute pancreatitis without necrosis or infection, unspecified: Secondary | ICD-10-CM

## 2013-09-30 DIAGNOSIS — L509 Urticaria, unspecified: Secondary | ICD-10-CM | POA: Insufficient documentation

## 2013-09-30 DIAGNOSIS — Z9889 Other specified postprocedural states: Secondary | ICD-10-CM | POA: Insufficient documentation

## 2013-09-30 DIAGNOSIS — F1210 Cannabis abuse, uncomplicated: Secondary | ICD-10-CM | POA: Insufficient documentation

## 2013-09-30 DIAGNOSIS — Z87891 Personal history of nicotine dependence: Secondary | ICD-10-CM | POA: Insufficient documentation

## 2013-09-30 DIAGNOSIS — Z8659 Personal history of other mental and behavioral disorders: Secondary | ICD-10-CM | POA: Insufficient documentation

## 2013-09-30 DIAGNOSIS — R1013 Epigastric pain: Secondary | ICD-10-CM | POA: Insufficient documentation

## 2013-09-30 DIAGNOSIS — Z79899 Other long term (current) drug therapy: Secondary | ICD-10-CM | POA: Insufficient documentation

## 2013-09-30 LAB — CBC WITH DIFFERENTIAL/PLATELET
BASOS ABS: 0 10*3/uL (ref 0.0–0.1)
Basophils Absolute: 0 10*3/uL (ref 0.0–0.1)
Basophils Relative: 0 % (ref 0–1)
Basophils Relative: 0 % (ref 0–1)
EOS ABS: 0 10*3/uL (ref 0.0–0.7)
Eosinophils Absolute: 0 10*3/uL (ref 0.0–0.7)
Eosinophils Relative: 0 % (ref 0–5)
Eosinophils Relative: 0 % (ref 0–5)
HEMATOCRIT: 40.3 % (ref 39.0–52.0)
HEMATOCRIT: 38.6 % — AB (ref 39.0–52.0)
HEMOGLOBIN: 14 g/dL (ref 13.0–17.0)
Hemoglobin: 14.2 g/dL (ref 13.0–17.0)
Lymphocytes Relative: 23 % (ref 12–46)
Lymphocytes Relative: 9 % — ABNORMAL LOW (ref 12–46)
Lymphs Abs: 3 10*3/uL (ref 0.7–4.0)
Lymphs Abs: 1.7 10*3/uL (ref 0.7–4.0)
MCH: 32.4 pg (ref 26.0–34.0)
MCH: 32.5 pg (ref 26.0–34.0)
MCHC: 35.2 g/dL (ref 30.0–36.0)
MCHC: 36.3 g/dL — ABNORMAL HIGH (ref 30.0–36.0)
MCV: 92 fL (ref 78.0–100.0)
MCV: 89.6 fL (ref 78.0–100.0)
MONO ABS: 0.8 10*3/uL (ref 0.1–1.0)
MONO ABS: 1.5 10*3/uL — AB (ref 0.1–1.0)
MONOS PCT: 6 % (ref 3–12)
MONOS PCT: 9 % (ref 3–12)
NEUTROS ABS: 9.3 10*3/uL — AB (ref 1.7–7.7)
NEUTROS ABS: 14.8 10*3/uL — AB (ref 1.7–7.7)
Neutrophils Relative %: 71 % (ref 43–77)
Neutrophils Relative %: 82 % — ABNORMAL HIGH (ref 43–77)
Platelets: 281 10*3/uL (ref 150–400)
Platelets: 345 10*3/uL (ref 150–400)
RBC: 4.38 MIL/uL (ref 4.22–5.81)
RBC: 4.31 MIL/uL (ref 4.22–5.81)
RDW: 12.6 % (ref 11.5–15.5)
RDW: 12.4 % (ref 11.5–15.5)
WBC: 13.1 10*3/uL — AB (ref 4.0–10.5)
WBC: 18 10*3/uL — ABNORMAL HIGH (ref 4.0–10.5)

## 2013-09-30 LAB — COMPREHENSIVE METABOLIC PANEL
ALBUMIN: 4.5 g/dL (ref 3.5–5.2)
ALK PHOS: 66 U/L (ref 39–117)
ALT: 11 U/L (ref 0–53)
ALT: 11 U/L (ref 0–53)
AST: 18 U/L (ref 0–37)
AST: 18 U/L (ref 0–37)
Albumin: 4.3 g/dL (ref 3.5–5.2)
Alkaline Phosphatase: 68 U/L (ref 39–117)
BUN: 6 mg/dL (ref 6–23)
BUN: 6 mg/dL (ref 6–23)
CALCIUM: 10.2 mg/dL (ref 8.4–10.5)
CHLORIDE: 98 meq/L (ref 96–112)
CO2: 24 meq/L (ref 19–32)
CO2: 22 mEq/L (ref 19–32)
CREATININE: 0.93 mg/dL (ref 0.50–1.35)
CREATININE: 0.9 mg/dL (ref 0.50–1.35)
Calcium: 9.8 mg/dL (ref 8.4–10.5)
Chloride: 98 mEq/L (ref 96–112)
GFR calc Af Amer: >90 mL/min (ref >90)
GFR calc Af Amer: >90 mL/min (ref >90)
GFR calc non Af Amer: >90 mL/min (ref >90)
Glucose, Bld: 83 mg/dL (ref 70–99)
Glucose, Bld: 100 mg/dL — ABNORMAL HIGH (ref 70–99)
POTASSIUM: 3.5 meq/L — AB (ref 3.7–5.3)
Potassium: 3.3 mEq/L — ABNORMAL LOW (ref 3.7–5.3)
Sodium: 136 mEq/L — ABNORMAL LOW (ref 137–147)
Sodium: 139 mEq/L (ref 137–147)
TOTAL PROTEIN: 7.1 g/dL (ref 6.0–8.3)
Total Bilirubin: 0.3 mg/dL (ref 0.3–1.2)
Total Bilirubin: 0.5 mg/dL (ref 0.3–1.2)
Total Protein: 6.9 g/dL (ref 6.0–8.3)

## 2013-09-30 LAB — URINALYSIS, ROUTINE W REFLEX MICROSCOPIC
BILIRUBIN URINE: NEGATIVE
Bilirubin Urine: NEGATIVE
GLUCOSE, UA: NEGATIVE mg/dL
GLUCOSE, UA: NEGATIVE mg/dL
Hgb urine dipstick: NEGATIVE
Hgb urine dipstick: NEGATIVE
Ketones, ur: NEGATIVE mg/dL
Ketones, ur: 40 mg/dL — AB
LEUKOCYTES UA: NEGATIVE
LEUKOCYTES UA: NEGATIVE
NITRITE: NEGATIVE
Nitrite: NEGATIVE
PH: 6.5 (ref 5.0–8.0)
PH: 7.5 (ref 5.0–8.0)
PROTEIN: NEGATIVE mg/dL
Protein, ur: NEGATIVE mg/dL
Specific Gravity, Urine: 1.003 — ABNORMAL LOW (ref 1.005–1.030)
Specific Gravity, Urine: 1.01 (ref 1.005–1.030)
Urobilinogen, UA: 0.2 mg/dL (ref 0.0–1.0)
Urobilinogen, UA: 0.2 mg/dL (ref 0.0–1.0)

## 2013-09-30 LAB — LIPASE, BLOOD
LIPASE: 1042 U/L — AB (ref 11–59)
LIPASE: 1858 U/L — AB (ref 11–59)

## 2013-09-30 MED ORDER — MORPHINE SULFATE 4 MG/ML IJ SOLN
4.0000 mg | Freq: Once | INTRAMUSCULAR | Status: AC
  Administered 2013-09-30: 4 mg via INTRAVENOUS
  Filled 2013-09-30: qty 1

## 2013-09-30 MED ORDER — IOHEXOL 300 MG/ML  SOLN
100.0000 mL | Freq: Once | INTRAMUSCULAR | Status: AC | PRN
  Administered 2013-09-30: 100 mL via INTRAVENOUS

## 2013-09-30 MED ORDER — SODIUM CHLORIDE 0.9 % IV BOLUS (SEPSIS)
1000.0000 mL | Freq: Once | INTRAVENOUS | Status: AC
  Administered 2013-09-30: 1000 mL via INTRAVENOUS

## 2013-09-30 MED ORDER — ONDANSETRON HCL 4 MG/2ML IJ SOLN
4.0000 mg | Freq: Once | INTRAMUSCULAR | Status: AC
  Administered 2013-09-30: 4 mg via INTRAVENOUS
  Filled 2013-09-30: qty 2

## 2013-09-30 MED ORDER — HYDROMORPHONE HCL PF 1 MG/ML IJ SOLN
1.0000 mg | Freq: Once | INTRAMUSCULAR | Status: AC
Start: 2013-09-30 — End: 2013-09-30
  Administered 2013-09-30: 1 mg via INTRAVENOUS
  Filled 2013-09-30: qty 1

## 2013-09-30 MED ORDER — OXYCODONE-ACETAMINOPHEN 5-325 MG PO TABS: 1.0000 | ORAL_TABLET | ORAL | Status: AC | PRN

## 2013-09-30 MED ORDER — IOHEXOL 300 MG/ML  SOLN
50.0000 mL | Freq: Once | INTRAMUSCULAR | Status: AC | PRN
  Administered 2013-09-30: 50 mL via ORAL

## 2013-09-30 MED ORDER — METHYLPREDNISOLONE SODIUM SUCC 125 MG IJ SOLR
125.0000 mg | Freq: Once | INTRAMUSCULAR | Status: AC
  Administered 2013-09-30: 125 mg via INTRAVENOUS
  Filled 2013-09-30: qty 2

## 2013-09-30 MED ORDER — DIPHENHYDRAMINE HCL 50 MG/ML IJ SOLN
25.0000 mg | Freq: Once | INTRAMUSCULAR | Status: AC
  Administered 2013-09-30: 25 mg via INTRAVENOUS
  Filled 2013-09-30: qty 1

## 2013-09-30 MED ORDER — ONDANSETRON HCL 4 MG PO TABS: 4.0000 mg | ORAL_TABLET | Freq: Four times a day (QID) | ORAL | Status: DC

## 2013-09-30 MED ORDER — PROMETHAZINE HCL 25 MG PO TABS: 25.0000 mg | ORAL_TABLET | Freq: Four times a day (QID) | ORAL | Status: AC | PRN

## 2013-09-30 MED ORDER — FAMOTIDINE IN NACL 20-0.9 MG/50ML-% IV SOLN
20.0000 mg | Freq: Once | INTRAVENOUS | Status: AC
  Administered 2013-09-30: 20 mg via INTRAVENOUS
  Filled 2013-09-30: qty 50

## 2013-09-30 MED ORDER — HYDROMORPHONE HCL PF 1 MG/ML IJ SOLN
1.0000 mg | Freq: Once | INTRAMUSCULAR | Status: AC
  Administered 2013-09-30: 1 mg via INTRAVENOUS
  Filled 2013-09-30: qty 1

## 2013-09-30 MED ORDER — HYDROCODONE-ACETAMINOPHEN 5-325 MG PO TABS: 1.0000 | ORAL_TABLET | ORAL | Status: DC | PRN

## 2013-09-30 NOTE — ED Provider Notes (Signed)
CSN: [id number]     Arrival date & time 09/30/13  0110 History   First MD Initiated Contact with Patient 09/30/13 0223     No chief complaint on file.    (Consider location/radiation/quality/duration/timing/severity/associated sxs/prior Treatment) HPI Patient is a 18 year old man who reports history of pancreatitis. He says he was not told the etiology of previous pancreatitis but told to avoid alcohol.  He presents with 2 distinct and separate complaints.  #1. He developed diffuse urticaria while peeling shrimp. He has no known seafood allergy. However, he has had multiple previous episodes of urticaria. His symptoms are improving. However, he notes that he still has some hives. No respiratory symptoms.  #2. Patient complains of epigastric pain which began this morning. The patient says he tried to "suffer through it". However, he would now like some pain medication. He describes his pain as aching,  nonradiating, 8/10 in severity. Worse with by mouth intake. Over, he has not had any nausea or vomiting. No fever. No diarrhea.  Past Medical History  Diagnosis Date  . Pancreatitis, necrotizing   . Pancreatitis 10/2011  . Marijuana use   . History of cocaine use     states only used 1x 2 weeks before UDS +, associated with pancreatitis   . ADHD (attention deficit hyperactivity disorder)   . Depression    Past Surgical History  Procedure Laterality Date  . Circumcision    . No past surgeries    . Eus  06/29/2012    Procedure: ESOPHAGEAL ENDOSCOPIC ULTRASOUND (EUS) RADIAL;  Surgeon: Willis Modena, MD;  Location: [facility];  Service: Endoscopy;  Laterality: N/A;  . Esophagogastroduodenoscopy  08/03/2012    Procedure: ESOPHAGOGASTRODUODENOSCOPY (EGD);  Surgeon: Willis Modena, MD;  Location: [facility];  Service: Endoscopy;  Laterality: N/A;  . Eus  08/03/2012    Procedure: UPPER ENDOSCOPIC ULTRASOUND (EUS) LINEAR;  Surgeon: Willis Modena, MD;  Location: [facility];  Service:  Endoscopy;  Laterality: N/A;   Family History  Problem Relation Age of Onset  . Hypertension Mother   . Anxiety disorder Mother   . Cancer Father   . Diabetes Father   . Depression Father   . Drug abuse Father   . Cancer Paternal Aunt   . Heart disease Paternal Grandfather   . Emphysema Paternal Grandfather   . Heart attack Paternal Uncle    History  Substance Use Topics  . Smoking status: Former Smoker -- 0.25 packs/day for 2 years    Types: Cigarettes    Start date: 07/27/2012    Quit date: 11/04/2012  . Smokeless tobacco: Current User  . Alcohol Use: No     Comment: 12/16/11 "quit drinking end of  Sept. 2013; Reports that the most he drank was a 6 pack or 3-4 shots a liquor.    Review of Systems   Ten point review of symptoms performed and is negative with the exception of symptoms noted above.   Allergies  Shrimp  Home Medications   Current Outpatient Rx  Name  Route  Sig  Dispense  Refill  . hydrOXYzine (ATARAX/VISTARIL) 25 MG tablet   Oral   Take 25 mg by mouth every 6 (six) hours as needed.          Marland Kitchen Phenyleph-Doxylamine-DM-APAP (VICKS NYQUIL SEVERE COLD & FLU) 5-6.25-10-325 MG TABS   Oral   Take 1 tablet by mouth at bedtime as needed (cold/sleep).         . Phenylephrine-DM-GG-APAP (VICKS DAYQUIL SEVERE COLD/FLU) 5-10-200-325 MG TABS  Oral   Take 1 tablet by mouth every 6 (six) hours as needed (cold symptoms).         . ranitidine (ZANTAC) 150 MG capsule   Oral   Take 150 mg by mouth every evening.           BP 128/64  Pulse 83  Temp(Src) 97.6 F (36.4 C) (Oral)  Resp 18  Ht 6\' 1"  (1.854 m)  Wt 190 lb (86.183 kg)  BMI 25.07 kg/m2  SpO2 98% Physical Exam Gen: well developed and well nourished appearing Head: NCAT Eyes: PERL, EOMI Nose: no epistaixis or rhinorrhea Mouth/throat: mucosa is moist and pink Neck: supple, no stridor Lungs: CTA B, no wheezing, rhonchi or rales CV: regular rate and rythm, good distal pulses.  Abd: soft,  mildly tender midline epigastrium, nondistended Back: no ttp, no cva ttp Skin: scattered wheels - most notably over the torso, also present over arms.  Ext: no edema, normal to inspection Neuro: CN ii-xii grossly intact, no focal deficits Psyche; normal affect,  calm and cooperative.  ED Course  Procedures (including critical care time) Labs Review  Results for orders placed during the hospital encounter of 09/30/13 (from the past 24 hour(s))  CBC WITH DIFFERENTIAL     Status: Abnormal   Collection Time    09/30/13  2:10 AM      Result Value Ref Range   WBC 13.1 (*) 4.0 - 10.5 K/uL   RBC 4.38  4.22 - 5.81 MIL/uL   Hemoglobin 14.2  13.0 - 17.0 g/dL   HCT 16.1  09.6 - 04.5 %   MCV 92.0  78.0 - 100.0 fL   MCH 32.4  26.0 - 34.0 pg   MCHC 35.2  30.0 - 36.0 g/dL   RDW 40.9  81.1 - 91.4 %   Platelets 281  150 - 400 K/uL   Neutrophils Relative % 71  43 - 77 %   Neutro Abs 9.3 (*) 1.7 - 7.7 K/uL   Lymphocytes Relative 23  12 - 46 %   Lymphs Abs 3.0  0.7 - 4.0 K/uL   Monocytes Relative 6  3 - 12 %   Monocytes Absolute 0.8  0.1 - 1.0 K/uL   Eosinophils Relative 0  0 - 5 %   Eosinophils Absolute 0.0  0.0 - 0.7 K/uL   Basophils Relative 0  0 - 1 %   Basophils Absolute 0.0  0.0 - 0.1 K/uL  URINALYSIS, ROUTINE W REFLEX MICROSCOPIC     Status: Abnormal   Collection Time    09/30/13  2:30 AM      Result Value Ref Range   Color, Urine YELLOW  YELLOW   APPearance CLEAR  CLEAR   Specific Gravity, Urine 1.003 (*) 1.005 - 1.030   pH 6.5  5.0 - 8.0   Glucose, UA NEGATIVE  NEGATIVE mg/dL   Hgb urine dipstick NEGATIVE  NEGATIVE   Bilirubin Urine NEGATIVE  NEGATIVE   Ketones, ur NEGATIVE  NEGATIVE mg/dL   Protein, ur NEGATIVE  NEGATIVE mg/dL   Urobilinogen, UA 0.2  0.0 - 1.0 mg/dL   Nitrite NEGATIVE  NEGATIVE   Leukocytes, UA NEGATIVE  NEGATIVE   Results for orders placed during the hospital encounter of 09/30/13 (from the past 24 hour(s))  CBC WITH DIFFERENTIAL     Status: Abnormal    Collection Time    09/30/13  2:10 AM      Result Value Ref Range   WBC 13.1 (*) 4.0 -  10.5 K/uL   RBC 4.38  4.22 - 5.81 MIL/uL   Hemoglobin 14.2  13.0 - 17.0 g/dL   HCT 16.1  09.6 - 04.5 %   MCV 92.0  78.0 - 100.0 fL   MCH 32.4  26.0 - 34.0 pg   MCHC 35.2  30.0 - 36.0 g/dL   RDW 40.9  81.1 - 91.4 %   Platelets 281  150 - 400 K/uL   Neutrophils Relative % 71  43 - 77 %   Neutro Abs 9.3 (*) 1.7 - 7.7 K/uL   Lymphocytes Relative 23  12 - 46 %   Lymphs Abs 3.0  0.7 - 4.0 K/uL   Monocytes Relative 6  3 - 12 %   Monocytes Absolute 0.8  0.1 - 1.0 K/uL   Eosinophils Relative 0  0 - 5 %   Eosinophils Absolute 0.0  0.0 - 0.7 K/uL   Basophils Relative 0  0 - 1 %   Basophils Absolute 0.0  0.0 - 0.1 K/uL  COMPREHENSIVE METABOLIC PANEL     Status: Abnormal   Collection Time    09/30/13  2:10 AM      Result Value Ref Range   Sodium 136 (*) 137 - 147 mEq/L   Potassium 3.5 (*) 3.7 - 5.3 mEq/L   Chloride 98  96 - 112 mEq/L   CO2 24  19 - 32 mEq/L   Glucose, Bld 83  70 - 99 mg/dL   BUN 6  6 - 23 mg/dL   Creatinine, Ser 7.82  0.50 - 1.35 mg/dL   Calcium 9.8  8.4 - 95.6 mg/dL   Total Protein 6.9  6.0 - 8.3 g/dL   Albumin 4.3  3.5 - 5.2 g/dL   AST 18  0 - 37 U/L   ALT 11  0 - 53 U/L   Alkaline Phosphatase 66  39 - 117 U/L   Total Bilirubin 0.3  0.3 - 1.2 mg/dL   GFR calc non Af Amer >90  >90 mL/min   GFR calc Af Amer >90  >90 mL/min  LIPASE, BLOOD     Status: Abnormal   Collection Time    09/30/13  2:10 AM      Result Value Ref Range   Lipase 1042 (*) 11 - 59 U/L  URINALYSIS, ROUTINE W REFLEX MICROSCOPIC     Status: Abnormal   Collection Time    09/30/13  2:30 AM      Result Value Ref Range   Color, Urine YELLOW  YELLOW   APPearance CLEAR  CLEAR   Specific Gravity, Urine 1.003 (*) 1.005 - 1.030   pH 6.5  5.0 - 8.0   Glucose, UA NEGATIVE  NEGATIVE mg/dL   Hgb urine dipstick NEGATIVE  NEGATIVE   Bilirubin Urine NEGATIVE  NEGATIVE   Ketones, ur NEGATIVE  NEGATIVE mg/dL   Protein, ur  NEGATIVE  NEGATIVE mg/dL   Urobilinogen, UA 0.2  0.0 - 1.0 mg/dL   Nitrite NEGATIVE  NEGATIVE   Leukocytes, UA NEGATIVE  NEGATIVE      MDM   Patient with urticaria - responding well to Benadryl.   Also has acute excacerbation of chronic pancreatitis with elevated lipase at > 1000. We are treating with IVF, IV analgesia and antiemetic. Will re-evaluate for disposition.   2130: Patient says his pain is gone. He is tolerating po challenge with clear liquids (water and gingerale) in the ED. He would like to go home and I feel that this is  a safe plan with prescription for antiemetic and analgesia along with plan to f/u with GI, Dr. Dulce Sellarutlaw, on Monday and counsel regarding return precautions.   Brandt LoosenJulie Manly, MD 09/30/13 [id number]

## 2013-09-30 NOTE — ED Notes (Signed)
Patient has just been given 1mg  of dilaudid, and patient already requesting for another order of dilaudid just in case if he is in pain again. Pt 's request was denied, and informed that I will tell the doctor at the time your pain comes back.

## 2013-09-30 NOTE — ED Notes (Signed)
Dr Ward is at bedside

## 2013-09-30 NOTE — ED Notes (Signed)
Hx of pancreatitis since 2317. Takes Promethazine at home for nausea. Present to ED tonight due to uncontrolled nausea, vomiting and severe sharp 10/10 abdominal pain.  Attempted to drive himself to ED but unable to make it and had to pulled over to call the ambulance. Seem last night here for same. Pt is diaphoretic, VS stable. HR 100 sinus. Bowel sound present. Tender to palpate on RUQ.

## 2013-09-30 NOTE — ED Notes (Signed)
Pt mother stated pt was having difficult breathing.. Pt states he is fine and was sleeping at the time.vitals was done Rn charged made aware of situation.

## 2013-09-30 NOTE — ED Notes (Signed)
IV was started at 0130.

## 2013-09-30 NOTE — Discharge Instructions (Signed)
Acute Pancreatitis °Acute pancreatitis is a disease in which the pancreas becomes suddenly inflamed. The pancreas is a large gland located behind your stomach. The pancreas produces enzymes that help digest food. The pancreas also releases the hormones glucagon and insulin that help regulate blood sugar. Damage to the pancreas occurs when the digestive enzymes from the pancreas are activated and begin attacking the pancreas before being released into the intestine. Most acute attacks last a couple of days and can cause serious complications. Some people become dehydrated and develop low blood pressure. In severe cases, bleeding into the pancreas can lead to shock and can be life-threatening. The lungs, heart, and kidneys may fail. °CAUSES  °Pancreatitis can happen to anyone. In some cases, the cause is unknown. Most cases are caused by: °· Alcohol abuse. °· Gallstones. °Other less common causes are: °· Certain medicines. °· Exposure to certain chemicals. °· Infection. °· Damage caused by an accident (trauma). °· Abdominal surgery. °SYMPTOMS  °· Pain in the upper abdomen that may radiate to the back. °· Tenderness and swelling of the abdomen. °· Nausea and vomiting. °DIAGNOSIS  °Your caregiver will perform a physical exam. Blood and stool tests may be done to confirm the diagnosis. Imaging tests may also be done, such as X-rays, CT scans, or an ultrasound of the abdomen. °TREATMENT  °Treatment usually requires a stay in the hospital. Treatment may include: °· Pain medicine. °· Fluid replacement through an intravenous line (IV). °· Placing a tube in the stomach to remove stomach contents and control vomiting. °· Not eating for 3 or 4 days. This gives your pancreas a rest, because enzymes are not being produced that can cause further damage. °· Antibiotic medicines if your condition is caused by an infection. °· Surgery of the pancreas or gallbladder. °HOME CARE INSTRUCTIONS  °· Follow the diet advised by your  caregiver. This may involve avoiding alcohol and decreasing the amount of fat in your diet. °· Eat smaller, more frequent meals. This reduces the amount of digestive juices the pancreas produces. °· Drink enough fluids to keep your urine clear or pale yellow. °· Only take over-the-counter or prescription medicines as directed by your caregiver. °· Avoid drinking alcohol if it caused your condition. °· Do not smoke. °· Get plenty of rest. °· Check your blood sugar at home as directed by your caregiver. °· Keep all follow-up appointments as directed by your caregiver. °SEEK MEDICAL CARE IF:  °· You do not recover as quickly as expected. °· You develop new or worsening symptoms. °· You have persistent pain, weakness, or nausea. °· You recover and then have another episode of pain. °SEEK IMMEDIATE MEDICAL CARE IF:  °· You are unable to eat or keep fluids down. °· Your pain becomes severe. °· You have a fever or persistent symptoms for more than 2 to 3 days. °· You have a fever and your symptoms suddenly get worse. °· Your skin or the white part of your eyes turn yellow (jaundice). °· You develop vomiting. °· You feel dizzy, or you faint. °· Your blood sugar is high (over 300 mg/dL). °MAKE SURE YOU:  °· Understand these instructions. °· Will watch your condition. °· Will get help right away if you are not doing well or get worse. °Document Released: 07/13/2005 Document Revised: 01/12/2012 Document Reviewed: 10/22/2011 °[employer]® Patient Information ©2014 [employer]. ° °

## 2013-09-30 NOTE — ED Notes (Signed)
Pt's mother has been to nurses desk multiple times and used call bells, she says we need to do something,  She is advised she cannot come behind nurses desk and that the nurse and doctor have seen him and we cannot do anything with an order or protocol.

## 2013-09-30 NOTE — ED Notes (Signed)
CT notified that pt has completed contrast drink

## 2013-09-30 NOTE — ED Notes (Signed)
Pt reports touching shrimp tonight and had a rxn to it. Denies SOB. Positive for Rash all over body. History of pancreatitis and has been taking cold medicine with alcohol in it and is having pain 9/10 upper right abdomen.

## 2013-09-30 NOTE — ED Provider Notes (Signed)
TIME SEEN: 9:36 PM  CHIEF COMPLAINT: Abdominal pain  HPI: Patient is a 18 year old no history of pancreatitis, ADHD who presents emergency department with abdominal pain in the right upper quadrant it is sharp and feels like his prior episodes of pancreatitis. He states he has not had any alcohol recently. No heavy NSAID use. No fevers or chills. He has had nausea vomiting but no diarrhea. No bloody stool or melena.  ROS: See HPI Constitutional: no fever  Eyes: no drainage  ENT: no runny nose   Cardiovascular:  no chest pain  Resp: no SOB  GI: no vomiting GU: no dysuria Integumentary: no rash  Allergy: no hives  Musculoskeletal: no leg swelling  Neurological: no slurred speech ROS otherwise negative  PAST MEDICAL HISTORY/PAST SURGICAL HISTORY:  Past Medical History  Diagnosis Date  . Pancreatitis, necrotizing   . Pancreatitis 10/2011  . Marijuana use   . History of cocaine use     states only used 1x 2 weeks before UDS +, associated with pancreatitis   . ADHD (attention deficit hyperactivity disorder)   . Depression     MEDICATIONS:  Prior to Admission medications   Medication Sig Start Date End Date Taking? Authorizing Provider  hydrOXYzine (ATARAX/VISTARIL) 25 MG tablet Take 25 mg by mouth every 6 (six) hours as needed for anxiety or nausea.  07/26/13  Yes Historical Provider, MD  oxyCODONE-acetaminophen (PERCOCET/ROXICET) 5-325 MG per tablet Take 1-2 tablets by mouth every 4 (four) hours as needed for severe pain. 09/30/13  Yes Brandt Loosen, MD  promethazine (PHENERGAN) 25 MG tablet Take 1 tablet (25 mg total) by mouth every 6 (six) hours as needed for nausea or vomiting. 09/30/13  Yes Brandt Loosen, MD    ALLERGIES:  Allergies  Allergen Reactions  . Vicodin [Hydrocodone-Acetaminophen] Other (See Comments)    headache    SOCIAL HISTORY:  History  Substance Use Topics  . Smoking status: Former Smoker -- 0.25 packs/day for 2 years    Types: Cigarettes    Start date:  07/27/2012    Quit date: 11/04/2012  . Smokeless tobacco: Current User  . Alcohol Use: No     Comment: 12/16/11 "quit drinking end of  Sept. 2013; Reports that the most he drank was a 6 pack or 3-4 shots a liquor.    FAMILY HISTORY: Family History  Problem Relation Age of Onset  . Hypertension Mother   . Anxiety disorder Mother   . Cancer Father   . Diabetes Father   . Depression Father   . Drug abuse Father   . Cancer Paternal Aunt   . Heart disease Paternal Grandfather   . Emphysema Paternal Grandfather   . Heart attack Paternal Uncle     EXAM: BP 116/80  Pulse 112  Temp(Src) 97.7 F (36.5 C) (Oral)  Resp 20  SpO2 100% CONSTITUTIONAL: Alert and oriented and responds appropriately to questions. Well-appearing; well-nourished, appears uncomfortable HEAD: Normocephalic EYES: Conjunctivae clear, PERRL ENT: normal nose; no rhinorrhea; moist mucous membranes; pharynx without lesions noted NECK: Supple, no meningismus, no LAD  CARD: Tachycardic and regular; S1 and S2 appreciated; no murmurs, no clicks, no rubs, no gallops RESP: Normal chest excursion without splinting or tachypnea; breath sounds clear and equal bilaterally; no wheezes, no rhonchi, no rales,  ABD/GI: Normal bowel sounds; non-distended; soft, mildly diffusely tender to palpation in the upper abdomen, no rebound, no guarding BACK:  The back appears normal and is non-tender to palpation, there is no CVA tenderness EXT: Normal ROM  in all joints; non-tender to palpation; no edema; normal capillary refill; no cyanosis    SKIN: Normal color for age and race; warm NEURO: Moves all extremities equally PSYCH: The patient's mood and manner are appropriate. Grooming and personal hygiene are appropriate.  MEDICAL DECISION MAKING: Patient here with abdominal pain similar to his prior episodes of pancreatitis. He denies any abdominal surgeries in the past. He is tachycardic and appears uncomfortable exam of his abdominal exam is  non-peritoneal, he does not have a surgical abdomen. We'll check basic labs, urine. Will give IV fluids, nausea medicine pain medicine.  ED PROGRESS: Patient's pain and nausea and improved after Dilaudid and Zofran. His leukocytosis is 18 with left shift and is increased compared to his visit in the ED earlier this morning. His lipase is also elevated. He was seen in the emergency department last night for pancreatitis as well as urticaria but was not given steroids. Given his worsening pain and elevated leukocytosis with tachycardia, will repeat CT scan to evaluate for other abdominal pathology that may need surgical intervention or antibiotics. Patient's mother is very comfortable with this plan.   12:00 AM  Pt is much more comfortable after 2 rounds of IV Dilaudid and Zofran. His heart rate is now in the 80s. He is normotensive. Patient reports his pain is controlled and he would like to be discharged home. Have offered admission the patient and mother state they would like to go home.  CT scan shows acute pancreatitis with no collection or necrosis. He also has mild splenic vein narrowing with no thrombosis. Otherwise the CT scan is unremarkable. We'll discharge home with more pain medication, return precautions and supportive care instructions. Patient verbalizes understanding and is comfortable plan.  Layla MawKristen N Gao Mitnick, DO 10/01/13 0002

## 2013-10-01 MED ORDER — OXYCODONE-ACETAMINOPHEN 5-325 MG PO TABS: 1.0000 | ORAL_TABLET | ORAL | Status: AC | PRN

## 2013-10-01 NOTE — Discharge Instructions (Signed)
Acute Pancreatitis °Acute pancreatitis is a disease in which the pancreas becomes suddenly inflamed. The pancreas is a large gland located behind your stomach. The pancreas produces enzymes that help digest food. The pancreas also releases the hormones glucagon and insulin that help regulate blood sugar. Damage to the pancreas occurs when the digestive enzymes from the pancreas are activated and begin attacking the pancreas before being released into the intestine. Most acute attacks last a couple of days and can cause serious complications. Some people become dehydrated and develop low blood pressure. In severe cases, bleeding into the pancreas can lead to shock and can be life-threatening. The lungs, heart, and kidneys may fail. °CAUSES  °Pancreatitis can happen to anyone. In some cases, the cause is unknown. Most cases are caused by: °· Alcohol abuse. °· Gallstones. °Other less common causes are: °· Certain medicines. °· Exposure to certain chemicals. °· Infection. °· Damage caused by an accident (trauma). °· Abdominal surgery. °SYMPTOMS  °· Pain in the upper abdomen that may radiate to the back. °· Tenderness and swelling of the abdomen. °· Nausea and vomiting. °DIAGNOSIS  °Your caregiver will perform a physical exam. Blood and stool tests may be done to confirm the diagnosis. Imaging tests may also be done, such as X-rays, CT scans, or an ultrasound of the abdomen. °TREATMENT  °Treatment usually requires a stay in the hospital. Treatment may include: °· Pain medicine. °· Fluid replacement through an intravenous line (IV). °· Placing a tube in the stomach to remove stomach contents and control vomiting. °· Not eating for 3 or 4 days. This gives your pancreas a rest, because enzymes are not being produced that can cause further damage. °· Antibiotic medicines if your condition is caused by an infection. °· Surgery of the pancreas or gallbladder. °HOME CARE INSTRUCTIONS  °· Follow the diet advised by your  caregiver. This may involve avoiding alcohol and decreasing the amount of fat in your diet. °· Eat smaller, more frequent meals. This reduces the amount of digestive juices the pancreas produces. °· Drink enough fluids to keep your urine clear or pale yellow. °· Only take over-the-counter or prescription medicines as directed by your caregiver. °· Avoid drinking alcohol if it caused your condition. °· Do not smoke. °· Get plenty of rest. °· Check your blood sugar at home as directed by your caregiver. °· Keep all follow-up appointments as directed by your caregiver. °SEEK MEDICAL CARE IF:  °· You do not recover as quickly as expected. °· You develop new or worsening symptoms. °· You have persistent pain, weakness, or nausea. °· You recover and then have another episode of pain. °SEEK IMMEDIATE MEDICAL CARE IF:  °· You are unable to eat or keep fluids down. °· Your pain becomes severe. °· You have a fever or persistent symptoms for more than 2 to 3 days. °· You have a fever and your symptoms suddenly get worse. °· Your skin or the white part of your eyes turn yellow (jaundice). °· You develop vomiting. °· You feel dizzy, or you faint. °· Your blood sugar is high (over 300 mg/dL). °MAKE SURE YOU:  °· Understand these instructions. °· Will watch your condition. °· Will get help right away if you are not doing well or get worse. °Document Released: 07/13/2005 Document Revised: 01/12/2012 Document Reviewed: 10/22/2011 °[employer]® Patient Information ©2014 [employer]. ° °

## 2014-03-26 IMAGING — US US ABDOMEN LIMITED
1 series · 14 of 24 positions shown · non-contrast
Comparison: CT abdomen pelvis of 06/28/2012

CLINICAL DATA: Follow up of pancreatic pseudocyst

LIMITED ABDOMINAL ULTRASOUND

[Series 1: us abdomen limited · 0.28mm/px · 14 of 24 slices shown]
[im 1/24]
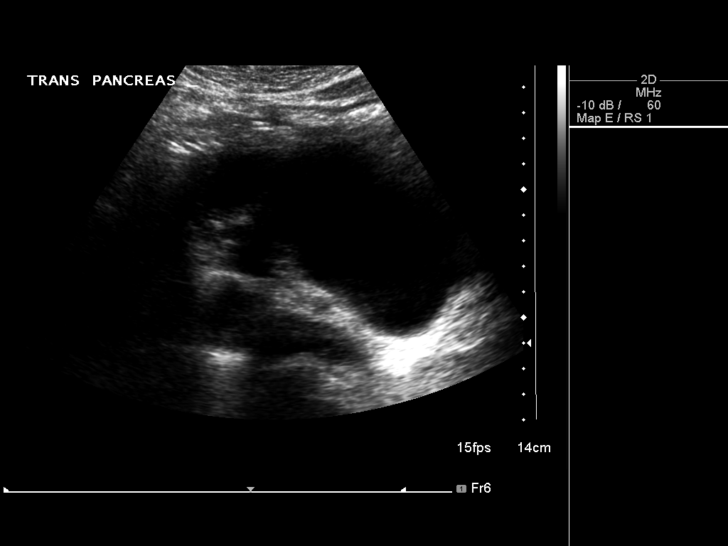
[im 3/24]
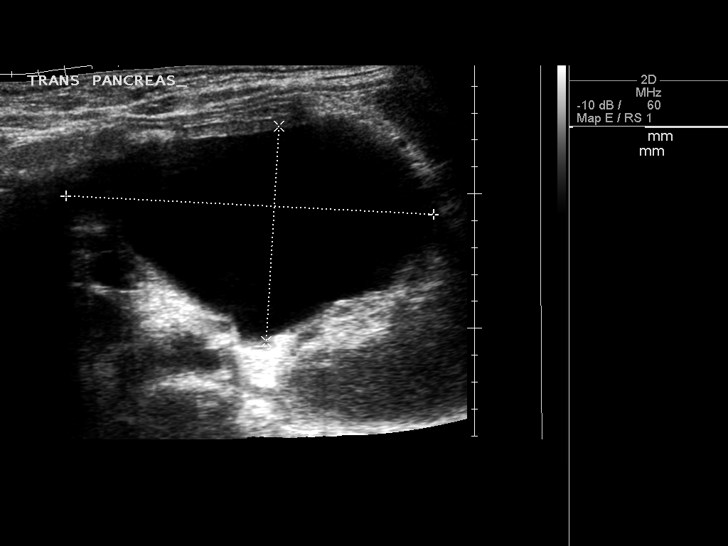
[im 5/24]
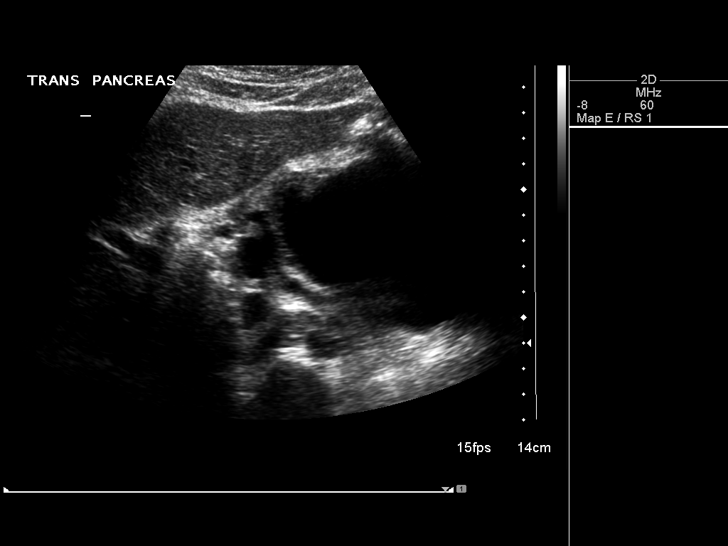
[im 7/24]
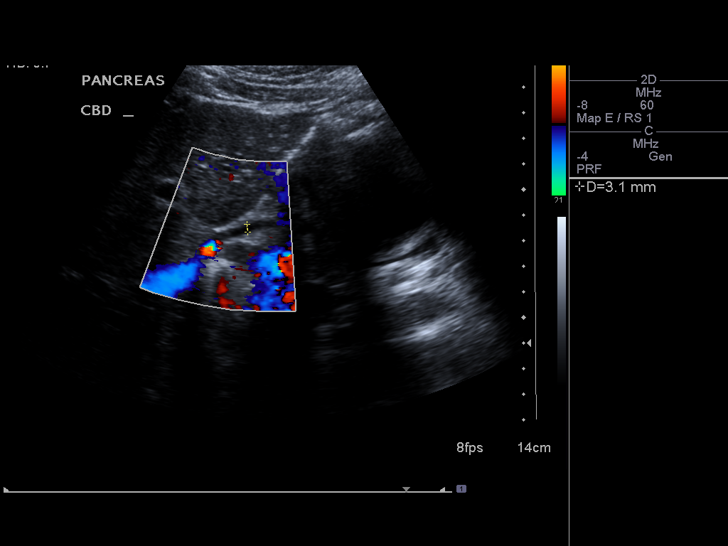
[im 8/24]
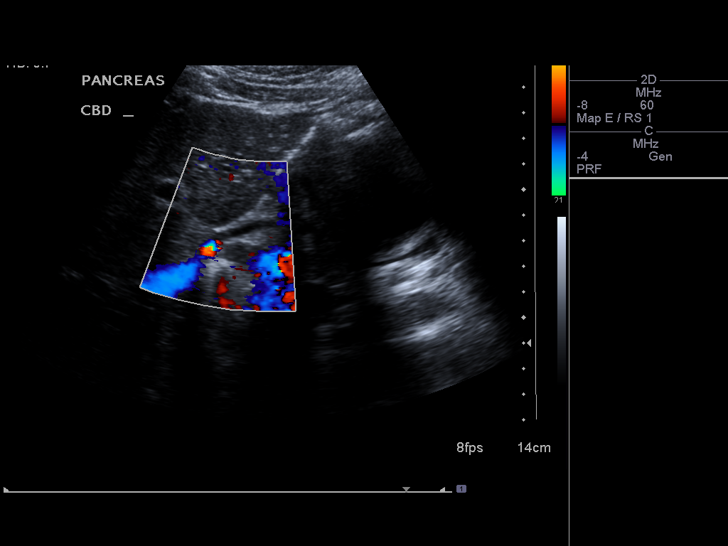
[im 10/24]
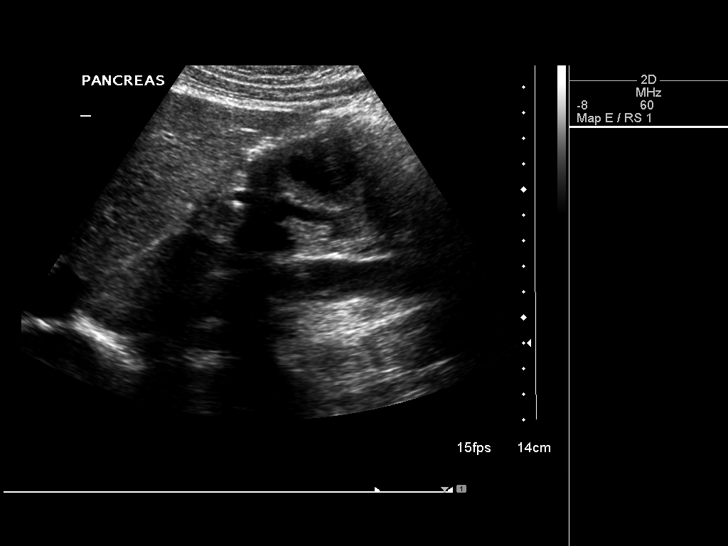
[im 12/24]
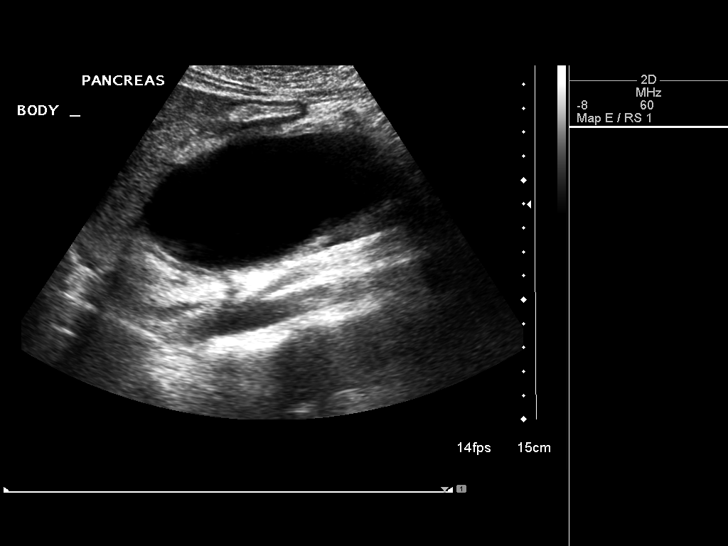
[im 13/24]
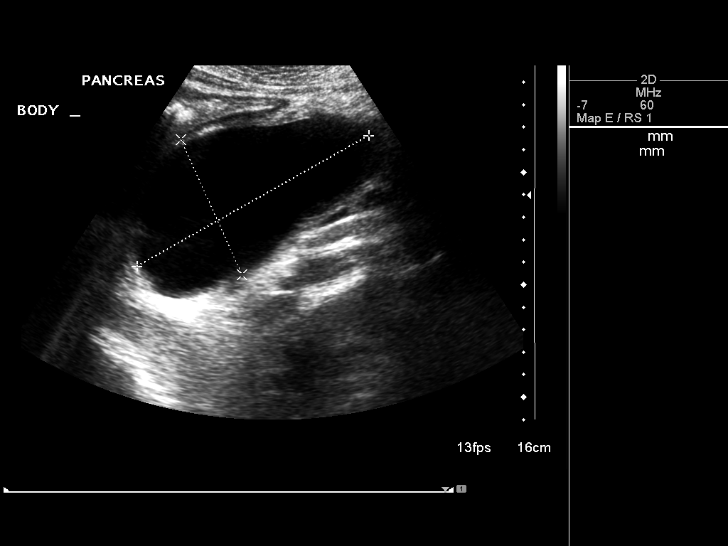
[im 15/24]
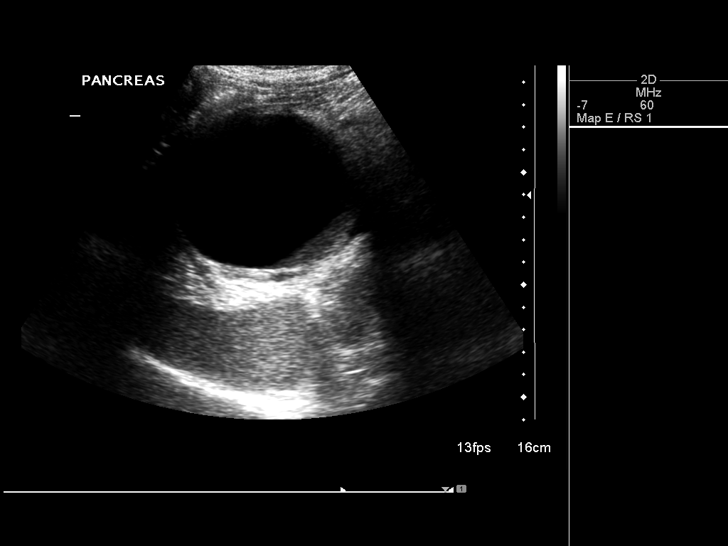
[im 17/24]
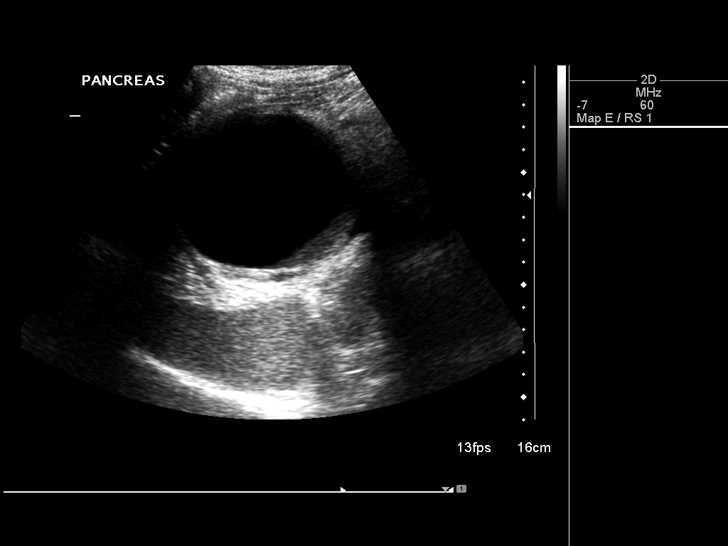
[im 19/24]
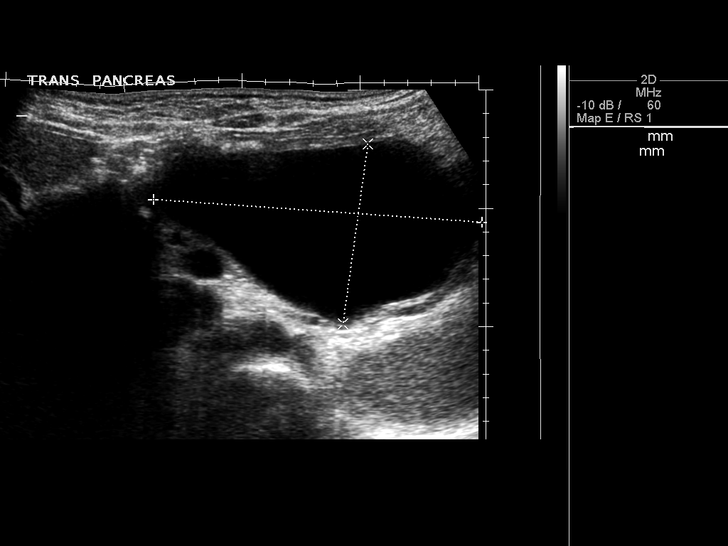
[im 20/24]
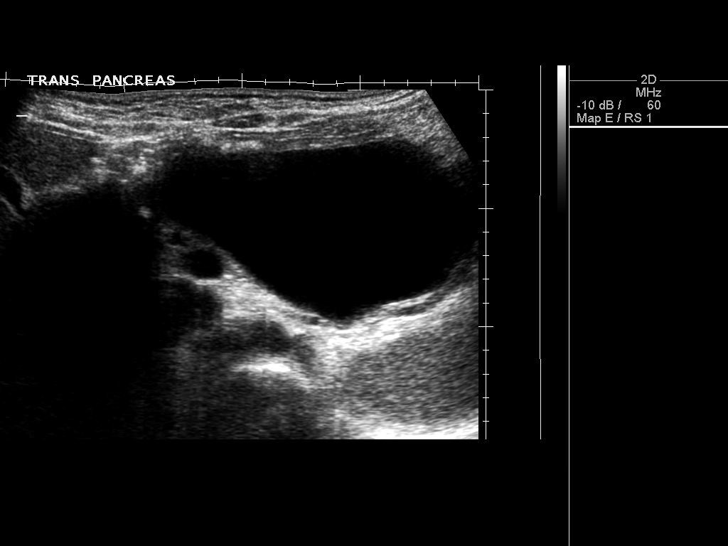
[im 22/24]
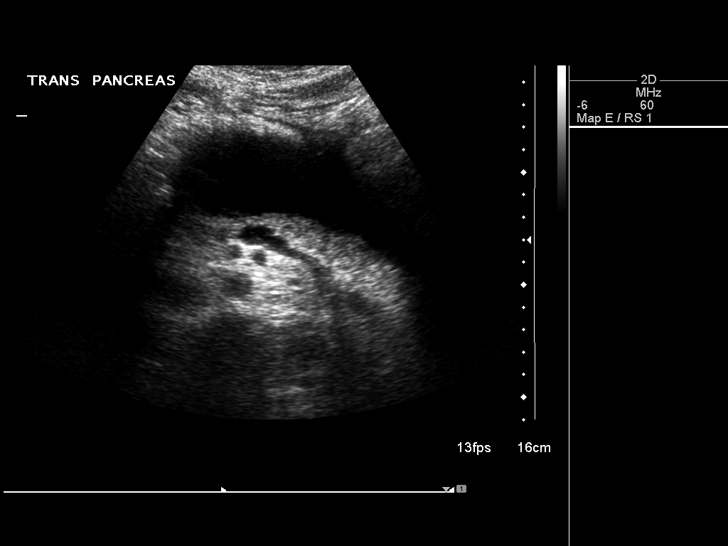
[im 24/24]
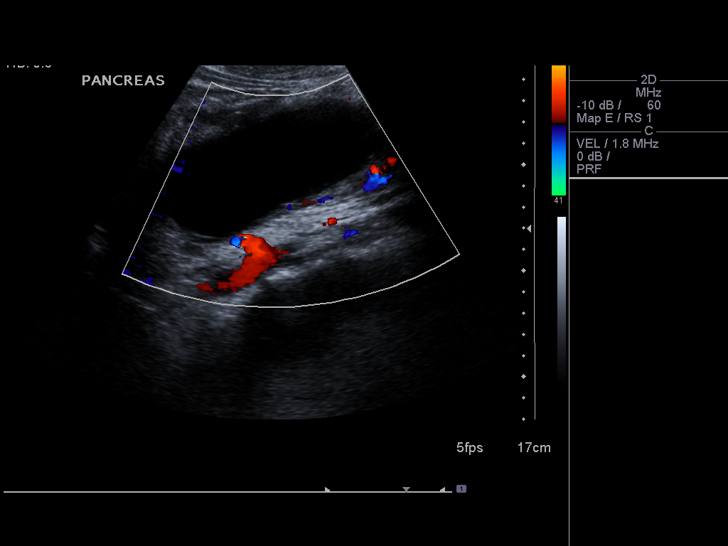

[14 of 24 positions shown; findings below may reference images not displayed]

FINDINGS: The cystic structure overlying the head and body
extending toward the tail the pancreas is again noted with only
minimal internal debris and thin septation.  This apparent
pancreatic pseudocyst has not changed significantly measuring
x 7.7 cm in the axial plane with the craniocaudad measurement of
11.0 cm. These measurements compared to the prior CT measurements
of 14.8 x 7.8 x 8.6 cm.  The common bile duct measures 3.1 mm in
diameter distally.
IMPRESSION: No significant change in the pancreatic pseudocyst with minimal
internal debris and faint peripheral septations.

## 2016-12-15 ENCOUNTER — Ambulatory Visit (INDEPENDENT_AMBULATORY_CARE_PROVIDER_SITE_OTHER): Payer: BLUE CROSS/BLUE SHIELD | Admitting: Physician Assistant

## 2016-12-15 ENCOUNTER — Encounter: Payer: Self-pay | Admitting: Family Medicine

## 2016-12-15 VITALS — BP 116/79 | HR 76 | Temp 98.2°F | Resp 18 | Ht 72.84 in | Wt 192.4 lb

## 2016-12-15 DIAGNOSIS — S61411A Laceration without foreign body of right hand, initial encounter: Secondary | ICD-10-CM | POA: Diagnosis not present

## 2016-12-15 DIAGNOSIS — Z23 Encounter for immunization: Secondary | ICD-10-CM

## 2016-12-15 NOTE — Patient Instructions (Addendum)
Take 600 mg of ibuprfen every 6-8 hours for pain.  Okay to add tylenol 1000 mg every eight hours.     WOUND CARE Please return in 10 days to have your stitches/staples removed or sooner if you have concerns. Marland Kitchen. Keep area clean and dry for 24 hours. Do not remove bandage, if applied. . After 24 hours, remove bandage and wash wound gently with mild soap and warm water. Reapply a new bandage after cleaning wound, if directed. . Continue daily cleansing with soap and water until stitches/staples are removed. . Do not apply any ointments or creams to the wound while stitches/staples are in place, as this may cause delayed healing. . Notify the office if you experience any of the following signs of infection: Swelling, redness, pus drainage, streaking, fever >101.0 F . Notify the office if you experience excessive bleeding that does not stop after 15-20 minutes of constant, firm pressure.     IF you received an x-ray today, you will receive an invoice from [facility]. Please contact [facility] at [phone] with questions or concerns regarding your invoice.   IF you received labwork today, you will receive an invoice from [facility]. Please contact [facility] at [phone] with questions or concerns regarding your invoice.   Our billing staff will not be able to assist you with questions regarding bills from these companies.  You will be contacted with the lab results as soon as they are available. The fastest way to get your results is to activate your My Chart account. Instructions are located on the last page of this paperwork. If you have not heard from us regarding the results in 2 weeks, please contact this office.

## 2016-12-15 NOTE — Progress Notes (Signed)
12/15/2016 2:56 PM   DOB: 1996/05/31 / MRN: [id number]  SUBJECTIVE:  Tim Huff is a 21 y.o. male presenting for a hand laceration that occurred while removing a drnking glass form the cabinet last night.  Tells me the glass broke and cut him in the right thumb webspace.  Last tdap no documented.  He can not remember the last time he had a tetanus shot.    He is allergic to vicodin [hydrocodone-acetaminophen].   He  has a past medical history of ADHD (attention deficit hyperactivity disorder); Depression; History of cocaine use; Marijuana use; Pancreatitis (10/2011); and Pancreatitis, necrotizing.    He  reports that he quit smoking about 4 years ago. His smoking use included Cigarettes. He started smoking about 4 years ago. He has a 0.50 pack-year smoking history. He uses smokeless tobacco. He reports that he uses drugs, including Marijuana, about 2 times per week. He reports that he does not drink alcohol. He  reports that he currently engages in sexual activity and has had male partners. The patient  has a past surgical history that includes Circumcision; No past surgeries; EUS (06/29/2012); Esophagogastroduodenoscopy (08/03/2012); and EUS (08/03/2012).  His family history includes Anxiety disorder in his mother; Cancer in his father and paternal aunt; Depression in his father; Diabetes in his father; Drug abuse in his father; Emphysema in his paternal grandfather; Heart attack in his paternal uncle; Heart disease in his paternal grandfather; Hypertension in his mother.  Review of Systems  Constitutional: Negative for chills and fever.  Neurological: Negative for tingling, tremors and focal weakness.    The problem list and medications were reviewed and updated by myself where necessary and exist elsewhere in the encounter.   OBJECTIVE:  BP 116/79 (BP Location: Right Arm, Patient Position: Sitting, Cuff Size: Small)   Pulse 76   Temp 98.2 F (36.8 C) (Oral)   Resp 18   Ht 6' 0.83"  (1.85 m)   Wt 192 lb 6.4 oz (87.3 kg)   SpO2 98%   BMI 25.50 kg/m   Physical Exam  Constitutional: He is oriented to person, place, and time.  Cardiovascular: Normal rate and regular rhythm.   Pulmonary/Chest: Effort normal and breath sounds normal.  Musculoskeletal: Normal range of motion.       Hands: Neurological: He is alert and oriented to person, place, and time.   Risk and benefits discussed and verbal consent obtained. Anesthetic allergies reviewed. Patient anesthetized using 1:1 mix of 2% lidocaine with epi and Marcaine. The wound was cleansed thoroughly with soap and water. Sterile prep and drape. Wound closed with 6 throws using 4-0 Ethilon suture material. Hemostasis achieved. Mupirocin applied to the wound and bandage placed. The patient tolerated well. Wound instructions were provided and the patient is to return in 9-10 days for suture removal.    ASSESSMENT AND PLAN:  [name] was seen today for laceration.  Diagnoses and all orders for this visit:  Laceration of right hand without foreign body, initial encounter: Repaired without difficulty. Patient tolerated without complaint.  Of note, patient and mother were very persistent regarding a narcotic prescription.  This was initially refused and then mother telling me later that it was necessary.  I again refused and advised Advil 600 +Tylenol q8.     The patient is advised to call or return to clinic if he does not see an improvement in symptoms, or to seek the care of the closest emergency department if he worsens with the above plan.  Deliah BostonMichael Damani Kelemen, MHS, PA-C [facility] 12/15/2016 2:56 PM

## 2016-12-30 ENCOUNTER — Ambulatory Visit (INDEPENDENT_AMBULATORY_CARE_PROVIDER_SITE_OTHER): Payer: BLUE CROSS/BLUE SHIELD | Admitting: Physician Assistant

## 2016-12-30 DIAGNOSIS — Z4802 Encounter for removal of sutures: Secondary | ICD-10-CM

## 2016-12-30 DIAGNOSIS — S61411D Laceration without foreign body of right hand, subsequent encounter: Secondary | ICD-10-CM

## 2016-12-30 NOTE — Progress Notes (Signed)
   Tim AlfredZachary A Huff  MRN: [id number] DOB: 1996-03-04  PCP: Georgianne Fickamachandran, Ajith, MD  No chief complaint on file.   Subjective:  Pt presents to clinic for suture removal.  No problems with the sutures.  He tried to take them out earlier today but was no successful  Review of Systems  Constitutional: Negative for chills and fever.  Skin: Positive for wound.    Patient Active Problem List   Diagnosis Date Noted  . Anxiety disorder 11/28/2012  . Marijuana dependence (HCC) 11/28/2012  . Alcohol abuse 11/28/2012  . Cocaine abuse 11/28/2012  . Benzodiazepine abuse 11/28/2012  . Opiate abuse, episodic 11/28/2012  . Acute pancreatitis 06/05/2012  . Nausea vomiting and diarrhea 06/05/2012  . Exposure to chlamydia 04/10/2012  . Abdominal pain 12/17/2011  . Pancreatitis, recurrent (HCC) 12/17/2011  . Pancreatitis, necrotizing, history of 11/08/2011  . disordered sleep 11/08/2011  . teen risky behavior 11/08/2011    Current Outpatient Prescriptions on File Prior to Visit  Medication Sig Dispense Refill  . hydrOXYzine (ATARAX/VISTARIL) 25 MG tablet Take 25 mg by mouth every 6 (six) hours as needed for anxiety or nausea.     Marland Kitchen. oxyCODONE-acetaminophen (PERCOCET/ROXICET) 5-325 MG per tablet Take 1-2 tablets by mouth every 4 (four) hours as needed for severe pain. (Patient not taking: Reported on 12/15/2016) 15 tablet 0  . oxyCODONE-acetaminophen (PERCOCET/ROXICET) 5-325 MG per tablet Take 1 tablet by mouth every 4 (four) hours as needed. (Patient not taking: Reported on 12/15/2016) 15 tablet 0  . promethazine (PHENERGAN) 25 MG tablet Take 1 tablet (25 mg total) by mouth every 6 (six) hours as needed for nausea or vomiting. (Patient not taking: Reported on 12/15/2016) 30 tablet 0   No current facility-administered medications on file prior to visit.     Allergies  Allergen Reactions  . Vicodin [Hydrocodone-Acetaminophen] Other (See Comments)    headache    Pt patients past, family and  social history were reviewed and updated.   Objective:  There were no vitals taken for this visit.  Physical Exam  Constitutional: He is oriented to person, place, and time and well-developed, well-nourished, and in no distress.  HENT:  Head: Normocephalic and atraumatic.  Right Ear: External ear normal.  Left Ear: External ear normal.  Eyes: Conjunctivae are normal.  Neck: Normal range of motion.  Pulmonary/Chest: Effort normal.  Neurological: He is alert and oriented to person, place, and time. Gait normal.  Skin: Skin is warm and dry.  Well healed wound on right webspace - sutures removed without problems - dried skin removed  Psychiatric: Mood, memory, affect and judgment normal.    Assessment and Plan :  Laceration of right hand without foreign body, subsequent encounter  Visit for suture removal well healed wound - d/w pt the wound care for the wound  Benny LennertSarah Crystle Carelli PA-C  Primary Care at [facility] 12/30/2016 4:38 PM

## 2021-05-09 ENCOUNTER — Ambulatory Visit (HOSPITAL_COMMUNITY)
Admission: EM | Admit: 2021-05-09 | Discharge: 2021-05-09 | Disposition: A | Discharge status: Home / Self Care | Payer: Self-pay | Attending: Medical Oncology | Admitting: Medical Oncology

## 2021-05-09 ENCOUNTER — Ambulatory Visit (INDEPENDENT_AMBULATORY_CARE_PROVIDER_SITE_OTHER): Payer: Self-pay

## 2021-05-09 ENCOUNTER — Encounter (HOSPITAL_COMMUNITY): Payer: Self-pay

## 2021-05-09 DIAGNOSIS — M79675 Pain in left toe(s): Secondary | ICD-10-CM

## 2021-05-09 DIAGNOSIS — S92515A Nondisplaced fracture of proximal phalanx of left lesser toe(s), initial encounter for closed fracture: Secondary | ICD-10-CM

## 2021-05-09 MED ORDER — IBUPROFEN 800 MG PO TABS: 800.0000 mg | ORAL_TABLET | Freq: Three times a day (TID) | ORAL | 0 refills | Status: AC

## 2021-05-09 NOTE — ED Triage Notes (Signed)
Pt presents with small toe injury after accidentally slamming it into the wall this evening.

## 2021-05-09 NOTE — ED Provider Notes (Addendum)
[facility]    CSN: [id number] Arrival date & time: 05/09/21  1756      History   Chief Complaint Chief Complaint  Patient presents with   Toe Injury    HPI Tim Huff is a 25 y.o. male.   HPI  Toe Injury: Patient states that shortly before arriving here he accidentally slammed his left fifth toe into the side of a wall.  He reports that he was chasing after his dogs were really excited as he is just gotten home.  He reports some bleeding of the toe but this has resolved.  He states initially the toe felt a bit numb but this has resolved.  He is taken ibuprofen which is helped take the edge off of symptoms.  He states he has not able to ambulate on the foot due to pain.  He reports that when he was in high school he thinks he may have fractured a metatarsal bone of this foot but is unsure.  No other injury. He states that he is UTD on his Tdap  Past Medical History:  Diagnosis Date   ADHD (attention deficit hyperactivity disorder)    Depression    History of cocaine use    states only used 1x 2 weeks before UDS +, associated with pancreatitis    Marijuana use    Pancreatitis 10/2011   Pancreatitis, necrotizing     Patient Active Problem List   Diagnosis Date Noted   Anxiety disorder 11/28/2012   Marijuana dependence (HCC) 11/28/2012   Alcohol abuse 11/28/2012   Cocaine abuse (HCC) 11/28/2012   Benzodiazepine abuse (HCC) 11/28/2012   Opiate abuse, episodic (HCC) 11/28/2012   Acute pancreatitis 06/05/2012   Nausea vomiting and diarrhea 06/05/2012   Exposure to chlamydia 04/10/2012   Abdominal pain 12/17/2011   Pancreatitis, recurrent 12/17/2011   Pancreatitis, necrotizing, history of 11/08/2011   disordered sleep 11/08/2011   teen risky behavior 11/08/2011    Past Surgical History:  Procedure Laterality Date   CIRCUMCISION     ESOPHAGOGASTRODUODENOSCOPY  08/03/2012   Procedure: ESOPHAGOGASTRODUODENOSCOPY (EGD);  Surgeon: Willis Modena, MD;   Location: [facility];  Service: Endoscopy;  Laterality: N/A;   EUS  06/29/2012   Procedure: ESOPHAGEAL ENDOSCOPIC ULTRASOUND (EUS) RADIAL;  Surgeon: Willis Modena, MD;  Location: [facility];  Service: Endoscopy;  Laterality: N/A;   EUS  08/03/2012   Procedure: UPPER ENDOSCOPIC ULTRASOUND (EUS) LINEAR;  Surgeon: Willis Modena, MD;  Location: [facility];  Service: Endoscopy;  Laterality: N/A;   NO PAST SURGERIES         Home Medications    Prior to Admission medications   Medication Sig Start Date End Date Taking? Authorizing Provider  hydrOXYzine (ATARAX/VISTARIL) 25 MG tablet Take 25 mg by mouth every 6 (six) hours as needed for anxiety or nausea.  07/26/13   [provider]  oxyCODONE-acetaminophen (PERCOCET/ROXICET) 5-325 MG per tablet Take 1-2 tablets by mouth every 4 (four) hours as needed for severe pain. Patient not taking: Reported on 12/15/2016 09/30/13   Brandt Loosen, MD  oxyCODONE-acetaminophen (PERCOCET/ROXICET) 5-325 MG per tablet Take 1 tablet by mouth every 4 (four) hours as needed. Patient not taking: Reported on 12/15/2016 10/01/13   Ward, Layla Maw, DO  promethazine (PHENERGAN) 25 MG tablet Take 1 tablet (25 mg total) by mouth every 6 (six) hours as needed for nausea or vomiting. Patient not taking: Reported on 12/15/2016 09/30/13   Brandt Loosen, MD    Family History Family History  Problem  Relation Age of Onset   Heart disease Paternal Grandfather    Emphysema Paternal Grandfather    Heart attack Paternal Uncle    Hypertension Mother    Anxiety disorder Mother    Cancer Father    Diabetes Father    Depression Father    Drug abuse Father    Cancer Paternal Aunt     Social History Social History   Tobacco Use   Smoking status: Former    Packs/day: 0.25    Years: 2.00    Pack years: 0.50    Types: Cigarettes    Start date: 07/27/2012    Quit date: 11/04/2012    Years since quitting: 8.5   Smokeless tobacco: Current  Substance Use Topics   Alcohol  use: No    Comment: 12/16/11 "quit drinking end of  Sept. 2013; Reports that the most he drank was a 6 pack or 3-4 shots a liquor.   Drug use: Yes    Frequency: 2.0 times per week    Types: Marijuana    Comment: 2 grams per week. Last used this past weekend     Allergies   Vicodin [hydrocodone-acetaminophen]   Review of Systems Review of Systems  As stated above in HPI Physical Exam Triage Vital Signs ED Triage Vitals  Enc Vitals Group     BP 05/09/21 1832 108/66     Pulse Rate 05/09/21 1832 80     Resp 05/09/21 1832 18     Temp 05/09/21 1832 97.8 F (36.6 C)     Temp Source 05/09/21 1832 Oral     SpO2 05/09/21 1832 99 %     Weight --      Height --      Head Circumference --      Peak Flow --      Pain Score 05/09/21 1835 7     Pain Loc --      Pain Edu? --      Excl. in GC? --    No data found.  Updated Vital Signs BP 108/66 (BP Location: Right Arm)   Pulse 80   Temp 97.8 F (36.6 C) (Oral)   Resp 18   SpO2 99%   Physical Exam Vitals and nursing note reviewed.  Constitutional:      General: He is not in acute distress.    Appearance: Normal appearance. He is not ill-appearing, toxic-appearing or diaphoretic.  Cardiovascular:     Pulses: Normal pulses.  Musculoskeletal:     Comments: Range of motion of toes intact.  There is tenderness to palpation of the left fifth toe.  Negative fracture testing of the left foot.   Skin:    General: Skin is warm.     Capillary Refill: Capillary refill takes less than 2 seconds.  Neurological:     General: No focal deficit present.     Mental Status: He is alert.     Sensory: No sensory deficit.     UC Treatments / Results  Labs (all labs ordered are listed, but only abnormal results are displayed) Labs Reviewed - No data to display  EKG   Radiology No results found.  Procedures Procedures (including critical care time)  Medications Ordered in UC Medications - No data to display  Initial Impression /  Assessment and Plan / UC Course  I have reviewed the triage vital signs and the nursing notes.  Pertinent labs & imaging results that were available during my care of the patient were  reviewed by me and considered in my medical decision making (see chart for details).     New.  Patient is wheelchair-bound due to pain.  Given the traumatic injury, skin breakdown and inability to ambulate on the foot we are going to assess with an x-ray today.Viewed and interpreted x-ray imaging myself which shows a fracture of the toe.  Given that patient has a history of fairly extensive drug use I am going to prescribe him ibuprofen 800 mg along with extra strength Tylenol to take over-the-counter as needed pairing with ibuprofen. Orthopedic follow up recommended. Discussed red flag signs and symptoms. Post Op shoe and crutches recommended.  Final Clinical Impressions(s) / UC Diagnoses   Final diagnoses:  None   Discharge Instructions   None    ED Prescriptions   None    PDMP not reviewed this encounter.   Rushie Chestnut, PA-C 05/09/21 1924    Rushie Chestnut, PA-C 05/09/21 1928

## 2022-07-30 ENCOUNTER — Telehealth: Payer: Self-pay | Admitting: Urgent Care

## 2022-07-30 DIAGNOSIS — R6889 Other general symptoms and signs: Secondary | ICD-10-CM

## 2022-07-30 MED ORDER — OSELTAMIVIR PHOSPHATE 75 MG PO CAPS: 75.0000 mg | ORAL_CAPSULE | Freq: Two times a day (BID) | ORAL | 0 refills | Status: AC

## 2022-07-30 NOTE — Progress Notes (Signed)
Virtual Visit Consent   Tim Huff, you are scheduled for a virtual visit with a [facility] provider today. Just as with appointments in the office, your consent must be obtained to participate. Your consent will be active for this visit and any virtual visit you may have with one of our providers in the next 365 days. If you have a MyChart account, a copy of this consent can be sent to you electronically.  As this is a virtual visit, video technology does not allow for your provider to perform a traditional examination. This may limit your provider's ability to fully assess your condition. If your provider identifies any concerns that need to be evaluated in person or the need to arrange testing (such as labs, EKG, etc.), we will make arrangements to do so. Although advances in technology are sophisticated, we cannot ensure that it will always work on either your end or our end. If the connection with a video visit is poor, the visit may have to be switched to a telephone visit. With either a video or telephone visit, we are not always able to ensure that we have a secure connection.  By engaging in this virtual visit, you consent to the provision of healthcare and authorize for your insurance to be billed (if applicable) for the services provided during this visit. Depending on your insurance coverage, you may receive a charge related to this service.  I need to obtain your verbal consent now. Are you willing to proceed with your visit today? Tim Huff has provided verbal consent on 07/30/2022 for a virtual visit (video or telephone). Tim Malling, PA  Date: 07/30/2022 9:37 PM  Virtual Visit via Video Note   I, Tim Huff, connected with  Tim Huff  ([id number], [birth year]) on 07/30/22 at  9:15 PM EST by a video-enabled telemedicine application and verified that I am speaking with the correct person using two identifiers.  Location: Patient: Virtual Visit Location  Patient: Home Provider: Virtual Visit Location Provider: Home Office   I discussed the limitations of evaluation and management by telemedicine and the availability of in person appointments. The patient expressed understanding and agreed to proceed.    History of Present Illness: Tim Huff is a 27 y.o. who identifies as a male who was assigned male at birth, and is being seen today for URI symptoms.  HPI: 27yo male presents today for flu like sx. States he went out with friends for New Years and hasn't felt great since then. For the past two days however, and much worse just yesterday, pt has experienced sore throat, hot/cold chills, cough, headache/migraine, body aches. Pt uncertain about fever but believes so. States he feels terrible. Just completed home covid test which was negative.   Problems:  Patient Active Problem List   Diagnosis Date Noted   Anxiety disorder 11/28/2012   Marijuana dependence ([place]) 11/28/2012   Alcohol abuse 11/28/2012   Cocaine abuse ([place]) 11/28/2012   Benzodiazepine abuse ([place]) 11/28/2012   Opiate abuse, episodic ([place]) 11/28/2012   Acute pancreatitis 06/05/2012   Nausea vomiting and diarrhea 06/05/2012   Exposure to chlamydia 04/10/2012   Abdominal pain 12/17/2011   Pancreatitis, recurrent 12/17/2011   Pancreatitis, necrotizing, history of 11/08/2011   disordered sleep 11/08/2011   teen risky behavior 11/08/2011    Allergies:  Allergies  Allergen Reactions   Vicodin [Hydrocodone-Acetaminophen] Other (See Comments)    headache   Medications:  Current Outpatient Medications:  oseltamivir (TAMIFLU) 75 MG capsule, Take 1 capsule (75 mg total) by mouth 2 (two) times daily for 5 days., Disp: 10 capsule, Rfl: 0   hydrOXYzine (ATARAX/VISTARIL) 25 MG tablet, Take 25 mg by mouth every 6 (six) hours as needed for anxiety or nausea. , Disp: , Rfl:    ibuprofen (ADVIL) 800 MG tablet, Take 1 tablet (800 mg total) by mouth 3 (three) times daily., Disp:  21 tablet, Rfl: 0   oxyCODONE-acetaminophen (PERCOCET/ROXICET) 5-325 MG per tablet, Take 1-2 tablets by mouth every 4 (four) hours as needed for severe pain. (Patient not taking: Reported on 12/15/2016), Disp: 15 tablet, Rfl: 0   oxyCODONE-acetaminophen (PERCOCET/ROXICET) 5-325 MG per tablet, Take 1 tablet by mouth every 4 (four) hours as needed. (Patient not taking: Reported on 12/15/2016), Disp: 15 tablet, Rfl: 0   promethazine (PHENERGAN) 25 MG tablet, Take 1 tablet (25 mg total) by mouth every 6 (six) hours as needed for nausea or vomiting. (Patient not taking: Reported on 12/15/2016), Disp: 30 tablet, Rfl: 0  Observations/Objective: Patient is well-developed, well-nourished in no acute distress.  Resting comfortably at home.  Head is normocephalic, atraumatic.  No labored breathing. No cough Speech is clear and coherent with logical content.  Patient is alert and oriented at baseline.  No diaphoresis  Assessment and Plan: 1. Flu-like symptoms  Pt with neg covid test, flu like sx for 24 hours. Will start tamiflu BID x 5 days.  Follow Up Instructions: I discussed the assessment and treatment plan with the patient. The patient was provided an opportunity to ask questions and all were answered. The patient agreed with the plan and demonstrated an understanding of the instructions.  A copy of instructions were sent to the patient via MyChart unless otherwise noted below.    The patient was advised to call back or seek an in-person evaluation if the symptoms worsen or if the condition fails to improve as anticipated.  Time:  I spent 8 minutes with the patient via telehealth technology discussing the above problems/concerns.    [place], PA

## 2022-07-30 NOTE — Patient Instructions (Signed)
Tim Huff, thank you for joining Chaney Malling, PA for today's virtual visit.  While this provider is not your primary care provider (PCP), if your PCP is located in our provider database this encounter information will be shared with them immediately following your visit.   [facility] account gives you access to today's visit and all your visits, tests, and labs performed at [facility] " click here if you don't have a [facility] account or go to [email or web address]  Consent: (Patient) Tim Huff provided verbal consent for this virtual visit at the beginning of the encounter.  Current Medications:  Current Outpatient Medications:    oseltamivir (TAMIFLU) 75 MG capsule, Take 1 capsule (75 mg total) by mouth 2 (two) times daily for 5 days., Disp: 10 capsule, Rfl: 0   hydrOXYzine (ATARAX/VISTARIL) 25 MG tablet, Take 25 mg by mouth every 6 (six) hours as needed for anxiety or nausea. , Disp: , Rfl:    ibuprofen (ADVIL) 800 MG tablet, Take 1 tablet (800 mg total) by mouth 3 (three) times daily., Disp: 21 tablet, Rfl: 0   oxyCODONE-acetaminophen (PERCOCET/ROXICET) 5-325 MG per tablet, Take 1-2 tablets by mouth every 4 (four) hours as needed for severe pain. (Patient not taking: Reported on 12/15/2016), Disp: 15 tablet, Rfl: 0   oxyCODONE-acetaminophen (PERCOCET/ROXICET) 5-325 MG per tablet, Take 1 tablet by mouth every 4 (four) hours as needed. (Patient not taking: Reported on 12/15/2016), Disp: 15 tablet, Rfl: 0   promethazine (PHENERGAN) 25 MG tablet, Take 1 tablet (25 mg total) by mouth every 6 (six) hours as needed for nausea or vomiting. (Patient not taking: Reported on 12/15/2016), Disp: 30 tablet, Rfl: 0   Medications ordered in this encounter:  Meds ordered this encounter  Medications   oseltamivir (TAMIFLU) 75 MG capsule    Sig: Take 1 capsule (75 mg total) by mouth 2 (two) times daily for 5 days.    Dispense:  10 capsule    Refill:  0     Order Specific Question:   Supervising Provider    Answer:   Chase Picket [id number]     *If you need refills on other medications prior to your next appointment, please contact your pharmacy*  Follow-Up: Call back or seek an in-person evaluation if the symptoms worsen or if the condition fails to improve as anticipated.  [facility] [phone]  Other Instructions You suspect you are positive for influenza. Please start taking the treatment as soon as possible. Tamiflu twice daily x 5 days. Monitor for upset stomach. This will help prevent worsening symptoms, and shorten the course of the flu. Please alternate Tylenol and ibuprofen as needed for aches and fever. Rest.  Drink plenty of water. You are considered contagious until 24 hours after your fever breaks.  Start OTC oscillococcinum for body aches   If you have been instructed to have an in-person evaluation today at a local Urgent Care facility, please use the link below. It will take you to a list of all of our available [facility], including address, phone number and hours of operation. Please do not delay care.  [facility]  If you or a family member do not have a primary care provider, use the link below to schedule a visit and establish care. When you choose a [facility] primary care physician or advanced practice provider, you gain a long-term partner in health. Find a Primary Care Provider  Learn more about [facility]'s in-office and virtual care options: [facility]
